# Patient Record
Sex: Female | Born: 1945 | Race: Black or African American | Hispanic: No | Marital: Married | Smoking: Never smoker
Health system: Southern US, Community
[De-identification: ages and names within clinical notes are randomized; demographics above are authoritative.]

## PROBLEM LIST (undated history)

## (undated) DIAGNOSIS — I639 Cerebral infarction, unspecified: Secondary | ICD-10-CM

## (undated) DIAGNOSIS — F329 Major depressive disorder, single episode, unspecified: Secondary | ICD-10-CM

## (undated) DIAGNOSIS — E119 Type 2 diabetes mellitus without complications: Secondary | ICD-10-CM

## (undated) DIAGNOSIS — E7800 Pure hypercholesterolemia, unspecified: Secondary | ICD-10-CM

## (undated) DIAGNOSIS — H269 Unspecified cataract: Secondary | ICD-10-CM

## (undated) DIAGNOSIS — D689 Coagulation defect, unspecified: Secondary | ICD-10-CM

## (undated) DIAGNOSIS — F32A Depression, unspecified: Secondary | ICD-10-CM

## (undated) DIAGNOSIS — I10 Essential (primary) hypertension: Secondary | ICD-10-CM

## (undated) HISTORY — DX: Unspecified cataract: H26.9

## (undated) HISTORY — PX: CATARACT EXTRACTION W/ INTRAOCULAR LENS  IMPLANT, BILATERAL: SHX1307

## (undated) HISTORY — DX: Major depressive disorder, single episode, unspecified: F32.9

## (undated) HISTORY — PX: LUMBAR DISC SURGERY: SHX700

## (undated) HISTORY — DX: Depression, unspecified: F32.A

## (undated) HISTORY — DX: Coagulation defect, unspecified: D68.9

---

## 1999-11-01 ENCOUNTER — Other Ambulatory Visit: Admission: RE | Admit: 1999-11-01 | Discharge: 1999-11-01 | Payer: Self-pay | Admitting: Obstetrics and Gynecology

## 2016-05-28 DIAGNOSIS — I639 Cerebral infarction, unspecified: Secondary | ICD-10-CM

## 2016-05-28 HISTORY — DX: Cerebral infarction, unspecified: I63.9

## 2016-05-28 HISTORY — PX: BREAST BIOPSY: SHX20

## 2016-07-14 ENCOUNTER — Emergency Department (HOSPITAL_COMMUNITY): Payer: Medicare Other

## 2016-07-14 ENCOUNTER — Emergency Department (HOSPITAL_COMMUNITY)
Admission: EM | Admit: 2016-07-14 | Discharge: 2016-07-14 | Disposition: A | Discharge status: Home / Self Care | Payer: Medicare Other | Attending: Emergency Medicine | Admitting: Emergency Medicine

## 2016-07-14 ENCOUNTER — Encounter (HOSPITAL_COMMUNITY): Payer: Self-pay | Admitting: *Deleted

## 2016-07-14 DIAGNOSIS — I10 Essential (primary) hypertension: Secondary | ICD-10-CM | POA: Diagnosis not present

## 2016-07-14 DIAGNOSIS — R930 Abnormal findings on diagnostic imaging of skull and head, not elsewhere classified: Secondary | ICD-10-CM | POA: Insufficient documentation

## 2016-07-14 DIAGNOSIS — Z794 Long term (current) use of insulin: Secondary | ICD-10-CM | POA: Insufficient documentation

## 2016-07-14 DIAGNOSIS — Z7982 Long term (current) use of aspirin: Secondary | ICD-10-CM | POA: Insufficient documentation

## 2016-07-14 DIAGNOSIS — E119 Type 2 diabetes mellitus without complications: Secondary | ICD-10-CM | POA: Insufficient documentation

## 2016-07-14 DIAGNOSIS — Z79899 Other long term (current) drug therapy: Secondary | ICD-10-CM | POA: Insufficient documentation

## 2016-07-14 DIAGNOSIS — Z8673 Personal history of transient ischemic attack (TIA), and cerebral infarction without residual deficits: Secondary | ICD-10-CM | POA: Diagnosis not present

## 2016-07-14 HISTORY — DX: Type 2 diabetes mellitus without complications: E11.9

## 2016-07-14 HISTORY — DX: Cerebral infarction, unspecified: I63.9

## 2016-07-14 HISTORY — DX: Essential (primary) hypertension: I10

## 2016-07-14 HISTORY — DX: Pure hypercholesterolemia, unspecified: E78.00

## 2016-07-14 LAB — COMPREHENSIVE METABOLIC PANEL
ALBUMIN: 3.6 g/dL (ref 3.5–5.0)
ALT: 67 U/L — ABNORMAL HIGH (ref 14–54)
AST: 40 U/L (ref 15–41)
Alkaline Phosphatase: 70 U/L (ref 38–126)
Anion gap: 8 (ref 5–15)
BUN: 11 mg/dL (ref 6–20)
CALCIUM: 9.5 mg/dL (ref 8.9–10.3)
CO2: 24 mmol/L (ref 22–32)
Chloride: 105 mmol/L (ref 101–111)
Creatinine, Ser: 0.74 mg/dL (ref 0.44–1.00)
GFR calc non Af Amer: >60 mL/min (ref >60)
GLUCOSE: 263 mg/dL — AB (ref 65–99)
POTASSIUM: 4.3 mmol/L (ref 3.5–5.1)
SODIUM: 137 mmol/L (ref 135–145)
Total Bilirubin: 0.7 mg/dL (ref 0.3–1.2)
Total Protein: 7 g/dL (ref 6.5–8.1)

## 2016-07-14 LAB — CBC WITH DIFFERENTIAL/PLATELET
BASOS PCT: 0 %
Basophils Absolute: 0 10*3/uL (ref 0.0–0.1)
EOS ABS: 0.1 10*3/uL (ref 0.0–0.7)
EOS PCT: 1 %
HCT: 36.3 % (ref 36.0–46.0)
HEMOGLOBIN: 12.6 g/dL (ref 12.0–15.0)
LYMPHS ABS: 2.9 10*3/uL (ref 0.7–4.0)
Lymphocytes Relative: 32 %
MCH: 31 pg (ref 26.0–34.0)
MCHC: 34.7 g/dL (ref 30.0–36.0)
MCV: 89.4 fL (ref 78.0–100.0)
MONO ABS: 0.6 10*3/uL (ref 0.1–1.0)
MONOS PCT: 6 %
NEUTROS PCT: 61 %
Neutro Abs: 5.6 10*3/uL (ref 1.7–7.7)
PLATELETS: 328 10*3/uL (ref 150–400)
RBC: 4.06 MIL/uL (ref 3.87–5.11)
RDW: 12.1 % (ref 11.5–15.5)
WBC: 9.2 10*3/uL (ref 4.0–10.5)

## 2016-07-14 LAB — I-STAT TROPONIN, ED: Troponin i, poc: 0 ng/mL (ref 0.00–0.08)

## 2016-07-14 MED ORDER — AMLODIPINE BESYLATE 5 MG PO TABS
5.0000 mg | ORAL_TABLET | Freq: Once | ORAL | Status: AC
Start: 2016-07-14 — End: 2016-07-14
  Administered 2016-07-14: 5 mg via ORAL
  Filled 2016-07-14: qty 1

## 2016-07-14 MED ORDER — AMLODIPINE BESYLATE 5 MG PO TABS: 5.0000 mg | ORAL_TABLET | Freq: Every day | ORAL | 0 refills | Status: DC

## 2016-07-14 NOTE — ED Notes (Signed)
Gave Pt a [place] sandwich and water per ok Delia Chimes

## 2016-07-14 NOTE — ED Notes (Signed)
Patient transported to CT 

## 2016-07-14 NOTE — ED Triage Notes (Signed)
Pt reports hx of stroke 2 weeks ago while visiting [place]. Pt had left side arm weakness and vision changes with stroke. Reports not feeling well since the stroke but has noticed that bp is elevated SBP >200 last night and today. Denies headache. bp is 187/86 at this time.

## 2016-07-14 NOTE — ED Provider Notes (Signed)
[facility] DEPT Provider Note   CSN: [id number] Arrival date & time: 07/14/16  0753     History   Chief Complaint Chief Complaint  Patient presents with  . Hypertension    HPI Claudia Jenkins is a 71 y.o. female.  HPI   71 yo F with PMHx of HTN, HLD, DM2, CVA who presents with hypertension. Pt was just hospitalized 2 weeks ago for acute CVA, with residual LUE weakness. She has been recovering well at home but over the last 2 days, noticed her BP rising. She took it last night and it was 220/110, and it was elevated again this morning. She went to [place] who sent her here. Denies any associated complaints. No HA, vision changes, CP, SOB, palpitations, No LE swelling. She has been taking her meds as prescribed. Was not on any meds prior to the hospitalization. No other medical complaints. No recent falls.  Past Medical History:  Diagnosis Date  . Diabetes mellitus without complication ([place])   . High cholesterol   . Hypertension   . Stroke [facility])     There are no active problems to display for this patient.   Past Surgical History:  Procedure Laterality Date  . BACK SURGERY      OB History    No data available       Home Medications    Prior to Admission medications   Medication Sig Start Date End Date Taking? Authorizing Provider  aspirin EC 81 MG tablet Take 81 mg by mouth daily.   Yes Historical Provider, MD  clopidogrel (PLAVIX) 75 MG tablet Take 75 mg by mouth daily.   Yes Historical Provider, MD  insulin glargine (LANTUS) 100 UNIT/ML injection Inject 45 Units into the skin at bedtime.   Yes Historical Provider, MD  losartan (COZAAR) 100 MG tablet Take 100 mg by mouth daily.   Yes Historical Provider, MD  rosuvastatin (CRESTOR) 40 MG tablet Take 40 mg by mouth daily.   Yes Historical Provider, MD  amLODipine (NORVASC) 5 MG tablet Take 1 tablet (5 mg total) by mouth daily. 07/14/16   Duffy Bruce, MD    Family History History reviewed. No pertinent family  history.  Social History Social History  Substance Use Topics  . Smoking status: Never Smoker  . Smokeless tobacco: Not on file  . Alcohol use No     Allergies   Penicillins   Review of Systems Review of Systems  Respiratory: Negative for chest tightness and shortness of breath.   Cardiovascular: Negative for chest pain and leg swelling.  Neurological: Positive for weakness (residual, left sided). Negative for headaches.  All other systems reviewed and are negative.    Physical Exam Updated Vital Signs BP 168/87   Pulse 98   Temp 98 F (36.7 C) (Oral)   Resp 20   Ht 5' 2.5" (1.588 m)   Wt 177 lb (80.3 kg)   SpO2 100%   BMI 31.86 kg/m   Physical Exam  Constitutional: She is oriented to person, place, and time. She appears well-developed and well-nourished. No distress.  HENT:  Head: Normocephalic and atraumatic.  Eyes: Conjunctivae are normal.  Neck: Neck supple.  Cardiovascular: Normal rate, regular rhythm and normal heart sounds.  Exam reveals no friction rub.   No murmur heard. Pulmonary/Chest: Effort normal and breath sounds normal. No respiratory distress. She has no wheezes. She has no rales.  Abdominal: She exhibits no distension.  Musculoskeletal: She exhibits no edema.  Neurological: She is alert  and oriented to person, place, and time. She exhibits normal muscle tone.  Skin: Skin is warm. Capillary refill takes less than 2 seconds.  Psychiatric: She has a normal mood and affect.  Nursing note and vitals reviewed.   Neurological Exam:  Mental Status: Alert and oriented to person, place, and time. Attention and concentration normal. Speech clear. Recent memory is intact. Cranial Nerves: Visual fields grossly intact. EOMI but dysconjugate gaze with mild left esotropia. No nystagmus noted. Facial sensation intact at forehead, maxillary cheek, and chin/mandible bilaterally. No facial asymmetry or weakness. Hearing grossly normal. Uvula is midline, and  palate elevates symmetrically. Normal SCM and trapezius strength. Tongue midline without fasciculations. Motor: Muscle strength 5/5 in proximal and distal UE and LE bilaterally, though subjectively diminished LUE. No pronator drift. Muscle tone normal. Reflexes: 2+ and symmetrical in all four extremities.  Sensation: Intact to light touch in upper and lower extremities distally bilaterally.  Gait: Normal without ataxia. Coordination: Normal FTN bilaterally.   ED Treatments / Results  Labs (all labs ordered are listed, but only abnormal results are displayed) Labs Reviewed  COMPREHENSIVE METABOLIC PANEL - Abnormal; Notable for the following:       Result Value   Glucose, Bld 263 (*)    ALT 67 (*)    All other components within normal limits  CBC WITH DIFFERENTIAL/PLATELET  I-STAT TROPOININ, ED    EKG  EKG Interpretation  Date/Time:  Saturday July 14 2016 09:01:33 EST Ventricular Rate:  83 PR Interval:    QRS Duration: 98 QT Interval:  370 QTC Calculation: 435 R Axis:   55 Text Interpretation:  Sinus rhythm LVH with secondary repolarization abnormality No old tracing to compare Confirmed by Cailin Gebel MD, Dearl Rudden [phone]) on 07/14/2016 9:19:42 AM       Radiology Ct Head Wo Contrast  Result Date: 07/14/2016 CLINICAL DATA:  Hypertension. Reported recent cerebrovascular accident EXAM: CT HEAD WITHOUT CONTRAST TECHNIQUE: Contiguous axial images were obtained from the base of the skull through the vertex without intravenous contrast. COMPARISON:  None. FINDINGS: Brain: There is age related volume loss. There is invagination of CSF into the sella. There is no intracranial mass, hemorrhage, extra-axial fluid collection, or midline shift. There is a recent appearing infarct in the medial right parietal lobe. There is an age uncertain but possibly fairly recent infarct in the mid to posterior right frontal lobe. Elsewhere, gray-white compartments are normal. Vascular: No hyperdense vessel.  There are foci of carotid siphon calcification bilaterally. Skull: There is bony destruction along the clivus with soft tissue opacity extending from the clivus into sphenoid sinus regions bilaterally. No acute fracture noted. Sinuses/Orbits: Opacification in posterior aspects of the sphenoid sinuses likely is due to mass causing destructive lesion involving the clivus. Other visualized paranasal sinuses are clear. Visualized orbits appear symmetric bilaterally. Other: Mastoid air cells are clear. IMPRESSION: Age uncertain but probable recent infarcts in the right parietal lobe and right frontal lobe regions. No hemorrhage. No well-defined mass or edema. Destructive lesion involving a portion of the clivus with soft tissue extension into the posterior sphenoid sinus regions. 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 This lesion measures 2.8 x 2.7 x 1.0 cm. Aggressive infection in this area is a differential consideration. 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 Advise brain MRI pre and post-contrast to further evaluate this lesion causing destruction of a portion of the clivus as well as to further evaluate the apparent infarcts in the right frontal and  right parietal lobes. Electronically Signed   By: Lowella Grip III M.D.   On: 07/14/2016 11:10    Procedures Procedures (including critical care time)  Medications Ordered in ED Medications  amLODipine (NORVASC) tablet 5 mg (5 mg Oral Given 07/14/16 1346)     Initial Impression / Assessment and Plan / ED Course  I have reviewed the triage vital signs and the nursing notes.  Pertinent labs & imaging results that were available during my care of the patient were reviewed by me and considered in my medical decision making (see chart for details).    71 yo F with PMHx of HTN, HLD, recent CVA here with asymptomatic HT. On arrival, pt hypertensive but o/w without complaints. Neuro exam is as above and is c/w her  previously documented exams from recent hospitalization at OSH (records reviewed). She has no HA, vision changes, CP, SOB, or sx to suggest HTN emergency/urgency. EKG is non-ischemic. Screening lab work unremarkable and shows no signs of new end organ dysfunction. Of note, CT head shows old infarct c/w her previously described MRIs. 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 I discussed case with Dr. Rande Lawman of Neurology. Will start pt on Norvasc given her HTN, recent CVA and DAPTuse and will d/c with outpt f/u. Pt in agreement. Return precautions given.  Final Clinical Impressions(s) / ED Diagnoses   Final diagnoses:  Essential hypertension    New Prescriptions Discharge Medication List as of 07/14/2016  1:49 PM    START taking these medications   Details  amLODipine (NORVASC) 5 MG tablet Take 1 tablet (5 mg total) by mouth daily., Starting Sat 07/14/2016, Print         Duffy Bruce, MD 07/14/16 2110

## 2016-07-14 NOTE — Discharge Instructions (Signed)
-   Take the new medication as prescribed. You can start this tomorrow, 2/18. - Be careful when transitioning from sitting to standing, or lying to sitting, as this medication may cause dizziness - Monitor your blood pressure closely  - Follow-up with ENT and Neurology as scheduled

## 2016-07-16 ENCOUNTER — Encounter: Payer: Self-pay | Admitting: Neurology

## 2016-07-16 ENCOUNTER — Ambulatory Visit (INDEPENDENT_AMBULATORY_CARE_PROVIDER_SITE_OTHER): Payer: Medicare Other | Admitting: Neurology

## 2016-07-16 VITALS — BP 187/98 | HR 107 | Ht 62.0 in | Wt 177.8 lb

## 2016-07-16 DIAGNOSIS — E1159 Type 2 diabetes mellitus with other circulatory complications: Secondary | ICD-10-CM | POA: Diagnosis not present

## 2016-07-16 DIAGNOSIS — I63513 Cerebral infarction due to unspecified occlusion or stenosis of bilateral middle cerebral arteries: Secondary | ICD-10-CM

## 2016-07-16 DIAGNOSIS — Z794 Long term (current) use of insulin: Secondary | ICD-10-CM | POA: Diagnosis not present

## 2016-07-16 DIAGNOSIS — E782 Mixed hyperlipidemia: Secondary | ICD-10-CM | POA: Diagnosis not present

## 2016-07-16 DIAGNOSIS — R220 Localized swelling, mass and lump, head: Secondary | ICD-10-CM | POA: Diagnosis not present

## 2016-07-16 DIAGNOSIS — I10 Essential (primary) hypertension: Secondary | ICD-10-CM | POA: Diagnosis not present

## 2016-07-16 DIAGNOSIS — J3489 Other specified disorders of nose and nasal sinuses: Secondary | ICD-10-CM

## 2016-07-16 DIAGNOSIS — I639 Cerebral infarction, unspecified: Secondary | ICD-10-CM | POA: Insufficient documentation

## 2016-07-16 NOTE — Patient Instructions (Signed)
-   continue plavix and creator for stroke prevention - increase ASA from 81mg  to 325mg  daily - will do CTA head and neck to evaluate vessels - check BP and glucose at home and record - BP goal not too low due to brain vessel closed, BP gaol is 130-160/70-90.  - follow up with cardiology and ENT - Follow up with your primary care physician for stroke risk factor modification. Recommend maintain blood pressure goal <130/80, diabetes with hemoglobin A1c goal below 7.0% and lipids with LDL cholesterol goal below 70 mg/dL.  - diabetic diet and regular exercise, diabetic education arranged by PCP.  - follow up in 3 months with me.

## 2016-07-17 ENCOUNTER — Telehealth: Payer: Self-pay | Admitting: Neurology

## 2016-07-17 DIAGNOSIS — J3489 Other specified disorders of nose and nasal sinuses: Secondary | ICD-10-CM | POA: Insufficient documentation

## 2016-07-17 DIAGNOSIS — I10 Essential (primary) hypertension: Secondary | ICD-10-CM | POA: Insufficient documentation

## 2016-07-17 DIAGNOSIS — E782 Mixed hyperlipidemia: Secondary | ICD-10-CM | POA: Insufficient documentation

## 2016-07-17 DIAGNOSIS — R220 Localized swelling, mass and lump, head: Secondary | ICD-10-CM

## 2016-07-17 DIAGNOSIS — E119 Type 2 diabetes mellitus without complications: Secondary | ICD-10-CM | POA: Insufficient documentation

## 2016-07-17 MED ORDER — ASPIRIN EC 325 MG PO TBEC: 325.0000 mg | DELAYED_RELEASE_TABLET | Freq: Every day | ORAL | 5 refills | Status: DC

## 2016-07-17 NOTE — Progress Notes (Signed)
NEUROLOGY CLINIC NEW PATIENT NOTE  NAME: Claudia Jenkins DOB: 1946-02-24 REFERRING PHYSICIAN: Velna Hatchet, MD  I saw Claudia Jenkins as a new consult in the neurovascular clinic today regarding  Chief Complaint  Patient presents with  . New Patient (Initial Visit)    Referral from [facility] for stroke, was in hospital in [place] for stroke while on vacation, Pt is from [place]  .  HPI: Claudia Jenkins is a 71 y.o. female with PMH of HTN, DM, HLD who presents as a new patient for a stroke.   Pt was on vacation in [place], [place] when she presented to [facility] on 06/21/16 for confusion for several days and left arm weakness for 3 days. As per husband, patient did not recognize her own car, take 20 minutes to complete things that normally will take her 30 seconds to complete. Patient seemed to have changes in vision because she was bumping into things at home. CT and MRI brain showed right MCA, MCA/PCA, and MCA/ACA watershed infarction. MRA head and neck showed multiple COW vessels involvement including occluded right PCA and right M1, and high grade stenosis of left M1 and A1. There is also occluded left VA. TTE EF > 65%. EKG shows sinus rhythm. LDL 160 and A1C 11.6. Local neurology was consulted and recommended DAPT. However, pt was discharge with ASA only along with crestor.   Back to [place], followed with PCP, put back on ASA 81mg  and plavix. Continued crestor. Referred here for further neurology follow up. Pt denies smoking or alcohol use. She was not aware that she has DM, HLD. She had HTN in the past but off BP meds due to BP in control. However, her BP was high during hospitatlization. Now on losartan, dose increased from 50mg  to 100mg .   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 ENT referral has been requested.  Cardiology referral also made due to HTN and left ventricular hypertrophy showing on TTE.       Past Medical History:  Diagnosis Date  . Diabetes mellitus without complication ([place])   . High cholesterol   . Hypertension   . Stroke [facility])    Past Surgical History:  Procedure Laterality Date  . BACK SURGERY     Family History  Problem Relation Age of Onset  . Stroke Sister    Current Outpatient Prescriptions  Medication Sig Dispense Refill  . amLODipine (NORVASC) 5 MG tablet Take 1 tablet (5 mg total) by mouth daily. (Patient taking differently: Take 10 mg by mouth daily. ) 30 tablet 0  . clopidogrel (PLAVIX) 75 MG tablet Take 75 mg by mouth daily.    . insulin glargine (LANTUS) 100 UNIT/ML injection Inject 45 Units into the skin at bedtime.    [name] losartan (COZAAR) 100 MG tablet Take 100 mg by mouth daily.    . rosuvastatin (CRESTOR) 40 MG tablet Take 40 mg by mouth daily.    [name] aspirin EC 325 MG tablet Take 1 tablet (325 mg total) by mouth daily. 30 tablet 5   No current facility-administered medications for this visit.    Allergies  Allergen Reactions  . Penicillins Rash   Social History   Social History  . Marital status: Married    Spouse name: N/A  .  Number of children: N/A  . Years of education: N/A   Occupational History  . Not on file.   Social History Main Topics  . Smoking status: Never Smoker  . Smokeless tobacco: Never Used  . Alcohol use No  . Drug use: No  . Sexual activity: Not on file   Other Topics Concern  . Not on file   Social History Narrative  . No narrative on file    Review of Systems Full 14 system review of systems performed and notable only for those listed, all others are neg:  Constitutional:  fatigue Cardiovascular:  Ear/Nose/Throat:   Skin:  Eyes:   Respiratory:   Gastroitestinal:  constipation Genitourinary:  Hematology/Lymphatic:   Endocrine:    Musculoskeletal:   Allergy/Immunology:   Neurological:  Confusion, weakness Psychiatric: decreased energy Sleep:    Physical Exam  Vitals:   07/16/16 1520  BP: (!) 187/98  Pulse: (!) 107    General - Well nourished, well developed, in no apparent distress.  Ophthalmologic - Sharp disc margins OU.  Cardiovascular - Regular rate and rhythm with no murmur.    Neck - supple, no nuchal rigidity .  Mental Status -  Level of arousal and orientation to time, place, and person were intact. Language including expression, naming, repetition, comprehension, reading, and writing was assessed and found intact. Attention span and concentration were normal. Fund of Knowledge was assessed and was intact.  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 III, IV, VI - Extraocular movements intact. V - Facial sensation intact bilaterally. VII - Facial movement intact bilaterally. VIII - Hearing & vestibular intact bilaterally. X - Palate elevates symmetrically. XI - Chin turning & shoulder shrug intact bilaterally. XII - Tongue protrusion intact.  Motor Strength - The patient's strength was normal in all extremities and pronator drift was absent.  Bulk was normal and fasciculations were absent.   Motor Tone - Muscle tone was assessed at the neck and appendages and was normal.  Reflexes - The patient's reflexes were normal in all extremities and she had no pathological reflexes.  Sensory - Light touch, temperature/pinprick were assessed and were decreased on the left arm.    Coordination - The patient had normal movements in the hands and feet with no ataxia or dysmetria.  Tremor was absent.  Gait and Station - The patient's transfers, posture, gait, station, and turns were observed as normal.   Imaging  I have personally reviewed the radiological images below and agree with the radiology interpretations.  MRI brain without contrast 06/25/2016 Expected  evolution of right cerebral hemisphere watershed vascular territory infarcts. No new restriction diffusion. Associated FLAIR signal. Stable associated local mass effect. No progressive midline shift. No evidence for hemorrhagic transformation. White matter signal abnormality likely reflective of chronic microvascular angiopathic change. Age appropriate brain parenchymal volume loss.  CT head without contrast 06/24/2016 Acute lacunar infarct within the left thalamus. Unchanged nonhemorrhagic subacute infarct in the right frontal and parieto-occipital lobes. Sphenoid sinus mass as described on the previous sinus MRI  CT head without contrast 06/23/2016 Stable areas of infarction in the right cerebral hemisphere likely subacute in the right frontal and parietoccipital lobes. no hemorrhage or mass effect. Stable lesion in the sphenoids in sinus.  MRA neck without contrast 06/22/2016 Left vertebral artery is occluded at its origin. No other hemodynamically significant stenosis of major arteries of the neck  MRA of the head without contrast 06/22/2016 Occluded left vertebral artery. Occluded  right PCA. Occluded right M1 segment with reconstitution of distal branches. High-grade stenosis of the left A1 and M1 segments  MRI sinus/brain with and without contrast 06/22/2016 1. Approximately 2.82.52 cm polyp mass, predominantly located within the posterior aspect of the sphenoid sinus. There are large areas of bony destruction present involving the floor of the sella, and a mass is contiguous with portions of the pituitary gland. This likely reflect of mama normal with no pituitary adenoma with growth into the sphenoid sinus. There is also destruction involving the floor of the sphenoid sinuses. There is concern for invasion of the right cavernous sinus. Neurosurgical/ENT evaluation is recommended. 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 The optic chiasm and hypothalamus extend inferiorly into the superior  aspect of the expanded sella. 2. multiple areas of enhancing early subacute infarction involving the watershed territory of the right cerebral hemisphere, as discussed above. Likely associated with minimal petechial hemorrhagic transformation associate with the infarct involving the lateral right occipital lobe. No associated significant mass effect or mass producing hematoma. Close clinical and/or imaging follow-up recommended. 3. normal unilateral increased T1 signal involving the right basal ganglia. This finding has been described in the setting of nonketotic hyperglycemia. Correlate clinically.  CT head without contrast 06/21/2016 1. Wedge-shaped hypodensity involving the right parietal occipital region concerning for evolving subacute infarct within the right MCA/PCA watershed territory. There is no hemorrhagic transformation. 2. age appropriate involutional changes of the brain with mild chronic microvascular ischemic change. 3. nonspecific soft tissue opacification within the sphenoid sinuses as described above. There are bony destructive changes which involving the floor of the sella as well as the floor of the sphenoid sinuses. The bony destructive changes raises suspicion for an underlying aggressive processes, such as sphenoid sinus mass lesion or invasive sinus disease. Recommend ENT consultation with direct visualization. Could also consider further evaluation with MRI of the sinus with and without contrast. Will will of the following  TTE  Moderate to severe concentric left ventricular hypertrophy is observed. There is a normal left ventricular systolic function. The estimated ejection fraction is greater than 65%. Abnormal left ventricular dystonic feeling is observed, consistent with impaired relaxation. There is a minimal aortic stenosis present. The peak instantaneous gradient of the aortic valve is 16 mmHg. There is a aortic annular calcification. Moderate aortic leaflet calcification  is visualized. There is mitral annular calcification. There is a trace of mitral regurgitation. There is a trace tricuspid Regurgitation. The Right Ventricular Systolic Pressure Is Calculated at 17 MmHg.  Lab Review 06/21/2016 WBC 12.15 platelet 364 hemoglobin 13.9  total cholesterol 250, TG 156, HDL 59, LDL 160 Glucose 199, creatinine 0.59, ALT 31, AST 23, hemoglobin A1c 11.6   Assessment and Plan:   In summary, Claudia Jenkins is a 71 y.o. female with PMH of HTN, DM, HLD admitted in [place], [place] on 06/21/16 for right MCA, MCA/PCA, and MCA/ACA watershed infarction. MRA head and neck showed multiple COW vessels involvement including occluded right PCA and right M1, and high grade stenosis of left M1 and A1. There is also occluded left VA. TTE EF > 65%. EKG shows sinus rhythm. LDL 160 and A1C 11.6. She is on ASA 81mg  and plavix and crestor. Her stroke most likely due to large vessel occlusion/stenosis due to uncontrolled HTN, DM and HLD. Will further pursue CTA head and neck as her MRA head and neck images not good enough for detailed evaluation. 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 Follow up with cardiology and ENT.    - continue plavix and creator for stroke prevention - increase ASA from 81mg  to 325mg  daily - will do CTA head and neck to evaluate vessels - check BP and glucose at home and record - BP goal not too low due to intracranial vessel occlusion, BP gaol is 130-160/70-90.  - follow up with cardiology and ENT - Follow up with your primary care physician for stroke risk factor modification. Recommend maintain blood pressure goal <130/80, diabetes with hemoglobin A1c goal below 7.0% and lipids with LDL cholesterol goal below 70 mg/dL.  - diabetic diet and regular exercise,  diabetic education.  - follow up in 3 months with me.   I encouraged the patient to discuss these important issues with her primary care physician.  I counseled the patient on measures to reduce stroke risk, including the importance of medication compliance, risk factor control, exercise, healthy diet, and avoidance of smoking.  I reviewed stroke warning signs and symptoms and appropriate actions to take if such occurs.   Thank you very much for the opportunity to participate in the care of this patient.  Please do not hesitate to call if any questions or concerns arise.  Orders Placed This Encounter  Procedures  . CT ANGIO HEAD W OR WO CONTRAST    Standing Status:   Future    Standing Expiration Date:   09/13/2017    Order Specific Question:   If indicated for the ordered procedure, I authorize the administration of contrast media per Radiology protocol    Answer:   Yes    Order Specific Question:   Reason for Exam (SYMPTOM  OR DIAGNOSIS REQUIRED)    Answer:   stroke    Order Specific Question:   Preferred imaging location?    Answer:   [facility]  . CT ANGIO NECK W OR WO CONTRAST    Standing Status:   Future    Standing Expiration Date:   09/13/2017    Order Specific Question:   If indicated for the ordered procedure, I authorize the administration of contrast media per Radiology protocol    Answer:   Yes    Order Specific Question:   Reason for Exam (SYMPTOM  OR DIAGNOSIS REQUIRED)    Answer:   stroke    Order Specific Question:   Preferred imaging location?    Answer:   Internal    Meds ordered this encounter  Medications  . aspirin EC 325 MG tablet    Sig: Take 1 tablet (325 mg total) by mouth daily.    Dispense:  30 tablet    Refill:  5    Patient Instructions  - continue plavix and creator for stroke prevention - increase ASA from 81mg  to 325mg  daily - will do CTA head and neck to evaluate vessels - check BP and glucose at home and record - BP goal not too  low due to brain vessel closed, BP gaol is 130-160/70-90.  - follow up with cardiology and ENT - Follow up with your primary care physician for stroke risk factor modification. Recommend maintain blood pressure goal <130/80, diabetes with hemoglobin A1c goal below 7.0% and lipids with LDL cholesterol goal below 70 mg/dL.  - diabetic diet and regular exercise, diabetic education arranged by PCP.  - follow up  in 3 months with me.    Rosalin Hawking, MD PhD [facility] [address], [place], [place] [address] [phone]

## 2016-07-17 NOTE — Telephone Encounter (Addendum)
Patient is calling to advise she is taking Linzess for bowel movement. She was to call and report this to the nurse. She also said she had a CT scan on 07-14-16 at [facility].

## 2016-07-18 NOTE — Telephone Encounter (Signed)
Change made to patients medication list. Linzess put on medication list.

## 2016-08-15 ENCOUNTER — Encounter: Payer: Self-pay | Admitting: Neurology

## 2016-08-15 NOTE — Progress Notes (Signed)
Clearance letter fax to [facility] at [phone] and [phone]. Both faxes were receive and confirmed.

## 2016-08-20 ENCOUNTER — Ambulatory Visit
Admission: RE | Admit: 2016-08-20 | Discharge: 2016-08-20 | Disposition: A | Discharge status: Home / Self Care | Payer: Medicare Other | Source: Ambulatory Visit | Attending: Neurology | Admitting: Neurology

## 2016-08-20 DIAGNOSIS — I63513 Cerebral infarction due to unspecified occlusion or stenosis of bilateral middle cerebral arteries: Secondary | ICD-10-CM

## 2016-08-20 MED ORDER — IOPAMIDOL (ISOVUE-370) INJECTION 76%
75.0000 mL | Freq: Once | INTRAVENOUS | Status: AC | PRN
  Administered 2016-08-20: 75 mL via INTRAVENOUS

## 2016-08-22 ENCOUNTER — Ambulatory Visit: Payer: Self-pay | Admitting: Otolaryngology

## 2016-08-22 ENCOUNTER — Telehealth: Payer: Self-pay

## 2016-08-22 NOTE — H&P (Signed)
HPI:   Claudia Jenkins is a 71 y.o. female who presents as a consult Patient.   Referring Provider: Holwerda, Aundria Mems, *  Chief complaint: Sphenoid sinus mass.  HPI: A few weeks ago while on vacation in [place] she suffered a stroke. During her workup there, CT of the head and MRI revealed what appeared to be an expansile mass in the sphenoid. She has never had any symptoms related to her nose or sinuses. She has had some trouble with her vision and is being worked up for possible visual field defect.  PMH/Meds/All/SocHx/FamHx/ROS:   Past Medical History:  Diagnosis Date  . Diabetes mellitus ([place])  . Hypertension  . Stroke [facility])   Past Surgical History:  Procedure Laterality Date  . BACK SURGERY   No family history of bleeding disorders, wound healing problems or difficulty with anesthesia.   Social History   Social History  . Marital status: N/A  Spouse name: N/A  . Number of children: N/A  . Years of education: N/A   Occupational History  . Not on file.   Social History Main Topics  . Smoking status: Never Smoker  . Smokeless tobacco: Never Used  . Alcohol use Not on file  . Drug use: Unknown  . Sexual activity: Not on file   Other Topics Concern  . Not on file   Social History Narrative  . No narrative on file   Current Outpatient Prescriptions:  . amLODIPine (NORVASC) 10 MG tablet, , Disp: , Rfl:  . aspirin 325 MG EC tablet *ANTIPLATELET*, Take by mouth., Disp: , Rfl:  . clopidogrel (PLAVIX) 75 mg tablet *ANTIPLATELET*, Take by mouth., Disp: , Rfl:  . insulin glargine (LANTUS) 100 unit/mL injection, Inject 45 Units into the skin., Disp: , Rfl:  . losartan (COZAAR) 100 MG tablet, Take by mouth., Disp: , Rfl:  . rosuvastatin (CRESTOR) 40 MG tablet, Take by mouth., Disp: , Rfl:   A complete ROS was performed with pertinent positives/negatives noted in the HPI. The remainder of the ROS are negative.   Physical Exam:   BP 150/80  Ht 1.581 m (5' 2.25")   Wt 80.3 kg (177 lb)  BMI 32.11 kg/m   General: Healthy and alert, in no distress, breathing easily. Normal affect. In a pleasant mood. Head: Normocephalic, atraumatic. No masses, or scars. Eyes: Pupils are equal, and reactive to light. Vision is grossly intact. No spontaneous or gaze nystagmus. Ears: Ear canals are clear. Tympanic membranes are intact, with normal landmarks and the middle ears are clear and healthy. Hearing: Grossly normal. Nose: Nasal cavities are clear with healthy mucosa, no polyps or exudate.Airways are patent. Face: No masses or scars, facial nerve function is symmetric. Oral Cavity: No mucosal abnormalities are noted. Tongue with normal mobility. Dentition appears healthy. Oropharynx: Tonsils are symmetric. There are no mucosal masses identified. Tongue base appears normal and healthy. Nasopharynx appears by intranasal exam with an otoscope. Larynx/Hypopharynx: deferred Chest: Deferred Neck: No palpable masses, no cervical adenopathy, no thyroid nodules or enlargement. Neuro: Cranial nerves II-XII will normal function. Balance: Normal gate. Other findings: none.  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 I have reviewed the CT scans. Information is limited based on the fact that they were head CTs and do not give full detail of the sinus anatomy. Recommend a dedicated sinus CT scan to  further evaluate this. We will discuss the possible need for biopsy following that.

## 2016-08-22 NOTE — Telephone Encounter (Signed)
-----   Message from Rosalin Hawking, MD sent at 08/20/2016  6:06 PM EDT ----- Could you please let the patient know that the CT test done today for looking at vessels showed similar findings as before with multiple vessel narrowing in the brain which related to her stroke in 05/2016. She needs to continue her current medication and check BP at home and BP not too low. Ideally, systolic BP goal 977-414. Thanks.  Rosalin Hawking, MD PhD Stroke Neurology 08/20/2016 6:06 PM

## 2016-08-22 NOTE — Telephone Encounter (Signed)
RN call patient back about her Ct angio head with or without contrast. Rn stated test done was looking for looking at vessels showed similar findings as before with multiple vessel narrowing in the brain which related to her stroke in 05/2016. She needs to continue her current medication and check BP at home and BP not too low. Ideally, systolic BP goal 267-124. Pt verbalized understanding and is taking her blood pressure and will keep a log of it.

## 2016-08-24 ENCOUNTER — Encounter (HOSPITAL_COMMUNITY)
Admission: RE | Admit: 2016-08-24 | Discharge: 2016-08-24 | Disposition: A | Discharge status: Home / Self Care | Payer: Medicare Other | Source: Ambulatory Visit | Attending: Otolaryngology | Admitting: Otolaryngology

## 2016-08-24 ENCOUNTER — Encounter (HOSPITAL_COMMUNITY): Payer: Self-pay

## 2016-08-24 ENCOUNTER — Other Ambulatory Visit (HOSPITAL_COMMUNITY): Payer: Medicare Other

## 2016-08-24 DIAGNOSIS — E119 Type 2 diabetes mellitus without complications: Secondary | ICD-10-CM | POA: Insufficient documentation

## 2016-08-24 DIAGNOSIS — Z794 Long term (current) use of insulin: Secondary | ICD-10-CM | POA: Diagnosis not present

## 2016-08-24 DIAGNOSIS — Z01812 Encounter for preprocedural laboratory examination: Secondary | ICD-10-CM | POA: Diagnosis not present

## 2016-08-24 DIAGNOSIS — Z8673 Personal history of transient ischemic attack (TIA), and cerebral infarction without residual deficits: Secondary | ICD-10-CM | POA: Diagnosis not present

## 2016-08-24 DIAGNOSIS — J341 Cyst and mucocele of nose and nasal sinus: Secondary | ICD-10-CM | POA: Insufficient documentation

## 2016-08-24 DIAGNOSIS — Z7982 Long term (current) use of aspirin: Secondary | ICD-10-CM | POA: Diagnosis not present

## 2016-08-24 DIAGNOSIS — Z79899 Other long term (current) drug therapy: Secondary | ICD-10-CM | POA: Insufficient documentation

## 2016-08-24 DIAGNOSIS — I10 Essential (primary) hypertension: Secondary | ICD-10-CM | POA: Diagnosis not present

## 2016-08-24 LAB — BASIC METABOLIC PANEL
Anion gap: 10 (ref 5–15)
BUN: 17 mg/dL (ref 6–20)
CO2: 24 mmol/L (ref 22–32)
Calcium: 9.6 mg/dL (ref 8.9–10.3)
Chloride: 105 mmol/L (ref 101–111)
Creatinine, Ser: 0.89 mg/dL (ref 0.44–1.00)
GFR calc Af Amer: >60 mL/min (ref >60)
Glucose, Bld: 95 mg/dL (ref 65–99)
POTASSIUM: 4.5 mmol/L (ref 3.5–5.1)
SODIUM: 139 mmol/L (ref 135–145)

## 2016-08-24 LAB — CBC
HEMATOCRIT: 36.3 % (ref 36.0–46.0)
Hemoglobin: 12.4 g/dL (ref 12.0–15.0)
MCH: 30.8 pg (ref 26.0–34.0)
MCHC: 34.2 g/dL (ref 30.0–36.0)
MCV: 90.1 fL (ref 78.0–100.0)
Platelets: 389 10*3/uL (ref 150–400)
RBC: 4.03 MIL/uL (ref 3.87–5.11)
RDW: 12.9 % (ref 11.5–15.5)
WBC: 12.6 10*3/uL — AB (ref 4.0–10.5)

## 2016-08-24 LAB — GLUCOSE, CAPILLARY: GLUCOSE-CAPILLARY: 105 mg/dL — AB (ref 65–99)

## 2016-08-24 NOTE — Pre-Procedure Instructions (Addendum)
[place]  08/24/2016      [facility], [place] [address] Phone: [phone] Fax: [phone]    Your procedure is scheduled on Wednesday, April 4.  Report to [facility] Admitting at 11:20 A.M.               Your surgery or procedure is scheduled for 1:20 PM   Call this number if you have problems the morning of surgery: [phone]    Remember:  Do not eat food or drink liquids after midnight Tuesday, April 3.  Take these medicines the morning of surgery with A SIP OF WATER : amLODipine (NORVASC),                     Do NOT Take Empagliflozin-Metformin HCl  (SYNJARDY )n the morning of surgery.                   STOP Plavix  as instructed by Dr Constance Holster and Dr Xu.--Per dr note stop today   How to Manage Your Diabetes Before and After Surgery  Why is it important to control my blood sugar before and after surgery? . Improving blood sugar levels before and after surgery helps healing and can limit problems. . A way of improving blood sugar control is eating a healthy diet by: o  Eating less sugar and carbohydrates o  Increasing activity/exercise o  Talking with your doctor about reaching your blood sugar goals . High blood sugars (greater than 180 mg/dL) can raise your risk of infections and slow your recovery, so you will need to focus on controlling your diabetes during the weeks before surgery. . Make sure that the doctor who takes care of your diabetes knows about your planned surgery including the date and location.  How do I manage my blood sugar before surgery? . Check your blood sugar at least 4 times a day, starting 2 days before surgery, to make sure that the level is not too high or low. o Check your blood sugar the morning of your surgery when you wake up and every 2 hours until you get to the Short Stay unit. . If your blood sugar is less than 70 mg/dL,  you will need to treat for low blood sugar: o Do not take insulin. o Treat a low blood sugar (less than 70 mg/dL) with  cup of clear juice (cranberry or apple), 4 glucose tablets, OR glucose gel. o Recheck blood sugar in 15 minutes after treatment (to make sure it is greater than 70 mg/dL). If your blood sugar is not greater than 70 mg/dL on recheck, call [phone] for further instructions. . Report your blood sugar to the short stay nurse when you get to Short Stay.  . If you are admitted to the hospital after surgery: o Your blood sugar will be checked by the staff and you will probably be given insulin after surgery (instead of oral diabetes medicines) to make sure you have good blood sugar levels. o The goal for blood sugar control after surgery is 80-180 mg/dL.  WHAT DO I DO ABOUT MY DIABETES MEDICATION  . Do not take oral diabetes medicines (pills) the morning of surgery.(No synjardy am of surgery)  . THE NIGHT BEFORE SURGERY, take 17 units of  Lantus insulin.       [place]- Preparing For Surgery  Before surgery, you  can play an important role. Because skin is not sterile, your skin needs to be as free of germs as possible. You can reduce the number of germs on your skin by washing with CHG (chlorahexidine gluconate) Soap before surgery.  CHG is an antiseptic cleaner which kills germs and bonds with the skin to continue killing germs even after washing.  Please do not use if you have an allergy to CHG or antibacterial soaps. If your skin becomes reddened/irritated stop using the CHG.  Do not shave (including legs and underarms) for at least 48 hours prior to first CHG shower. It is OK to shave your face.  Please follow these instructions carefully.   1. Shower the NIGHT BEFORE SURGERY and the MORNING OF SURGERY with CHG.   2. If you chose to wash your hair, wash your hair first as usual with your normal shampoo.  3. After you shampoo, rinse your hair and body thoroughly to  remove the shampoo.  4. Use CHG as you would any other liquid soap. You can apply CHG directly to the skin and wash gently with a scrungie or a clean washcloth.   5. Apply the CHG Soap to your body ONLY FROM THE NECK DOWN.  Do not use on open wounds or open sores. Avoid contact with your eyes, ears, mouth and genitals (private parts). Wash genitals (private parts) with your normal soap.  6. Wash thoroughly, paying special attention to the area where your surgery will be performed.  7. Thoroughly rinse your body with warm water from the neck down.  8. DO NOT shower/wash with your normal soap after using and rinsing off the CHG Soap.  9. Pat yourself dry with a CLEAN TOWEL.   10. Wear CLEAN PAJAMAS   11. Place CLEAN SHEETS on your bed the night of your first shower and DO NOT SLEEP WITH PETS.  Day of Surgery: Do not apply any deodorants/lotions. Please wear clean clothes to the hospital/surgery center.    Do not wear jewelry, make-up or nail polish.  Do not wear lotions, powders, or perfumes, or deoderant.  Do not shave 48 hours prior to surgery.  Men may shave face and neck.  Do not bring valuables to the hospital.  [facility] is not responsible for any belongings or valuables.  Contacts, dentures or bridgework may not be worn into surgery.  Leave your suitcase in the car.  After surgery it may be brought to your room.  For patients admitted to the hospital, discharge time will be determined by your treatment team.  Please read over the following fact sheets that you were given.

## 2016-08-25 LAB — HEMOGLOBIN A1C
Hgb A1c MFr Bld: 8.5 % — ABNORMAL HIGH (ref 4.8–5.6)
Mean Plasma Glucose: 197 mg/dL

## 2016-08-27 NOTE — Progress Notes (Signed)
Anesthesia Chart Review:  Pt is a 71 year old female scheduled for nasal endoscopy with biopsy of sphenoid and clivius mass on 08/29/2016 with Arville Care, MD  - PCP is Velna Hatchet, MD - Neurologist is Rosalin Hawking, MD, who cleared pt for surgery and gave ok to hold plavix for surgery.   PMH includes:  HTN, stroke (06/21/16 in [place]), DM, hyperlipidemia. Never smoker. BMI 31.  Medications include: Amlodipine, ASA 325 mg, Plavix, empagliflozin-metformin, Lantus, Cozaar, metoprolol, rosuvastatin. Pt stopped plavix 08/23/16. Pt to continue ASA perioperatively.   Preoperative labs reviewed.   - HbA1c 8.5, glucose 95 - PT/INR to be obtained DOS.   EKG 07/14/16: Sinus rhythm. LVH with secondary repolarization abnormality  Nuclear stress test 07/27/16 [facility] cardiovascular): - Normal myocardial perfusion imaging study with a fixed defect in the inferior wall suggesting an area of soft tissue attenuation from breast tissue. LV systolic function normal without regional wall motion abnormalities. EF 71%. This is a low risk study.  If no changes, I anticipate pt can proceed with surgery as scheduled.   Willeen Cass, FNP-BC [facility] Short Stay Surgical Center/Anesthesiology Phone: [phone] 08/27/2016 2:11 PM

## 2016-08-28 MED ORDER — CLINDAMYCIN PHOSPHATE 900 MG/50ML IV SOLN
900.0000 mg | INTRAVENOUS | Status: AC
  Administered 2016-08-29: 900 mg via INTRAVENOUS
  Filled 2016-08-28: qty 50

## 2016-08-28 MED ORDER — OXYMETAZOLINE HCL 0.05 % NA SOLN
3.0000 | NASAL | Status: AC
  Administered 2016-08-29: 3 via NASAL
  Filled 2016-08-28: qty 15

## 2016-08-29 ENCOUNTER — Encounter (HOSPITAL_COMMUNITY)
Admission: RE | Disposition: A | Discharge status: Home / Self Care | Payer: Self-pay | Source: Ambulatory Visit | Attending: Otolaryngology

## 2016-08-29 ENCOUNTER — Encounter (HOSPITAL_COMMUNITY): Payer: Self-pay | Admitting: *Deleted

## 2016-08-29 ENCOUNTER — Ambulatory Visit (HOSPITAL_COMMUNITY)
Admission: RE | Admit: 2016-08-29 | Discharge: 2016-08-29 | Disposition: A | Discharge status: Home / Self Care | Payer: Medicare Other | Source: Ambulatory Visit | Attending: Otolaryngology | Admitting: Otolaryngology

## 2016-08-29 ENCOUNTER — Ambulatory Visit (HOSPITAL_COMMUNITY): Payer: Medicare Other | Admitting: Certified Registered Nurse Anesthetist

## 2016-08-29 ENCOUNTER — Ambulatory Visit (HOSPITAL_COMMUNITY): Payer: Medicare Other | Admitting: Vascular Surgery

## 2016-08-29 DIAGNOSIS — Z8673 Personal history of transient ischemic attack (TIA), and cerebral infarction without residual deficits: Secondary | ICD-10-CM | POA: Insufficient documentation

## 2016-08-29 DIAGNOSIS — Z7982 Long term (current) use of aspirin: Secondary | ICD-10-CM | POA: Diagnosis not present

## 2016-08-29 DIAGNOSIS — E119 Type 2 diabetes mellitus without complications: Secondary | ICD-10-CM | POA: Diagnosis not present

## 2016-08-29 DIAGNOSIS — D352 Benign neoplasm of pituitary gland: Secondary | ICD-10-CM | POA: Diagnosis not present

## 2016-08-29 DIAGNOSIS — I10 Essential (primary) hypertension: Secondary | ICD-10-CM | POA: Insufficient documentation

## 2016-08-29 DIAGNOSIS — R220 Localized swelling, mass and lump, head: Secondary | ICD-10-CM | POA: Diagnosis present

## 2016-08-29 DIAGNOSIS — Z79899 Other long term (current) drug therapy: Secondary | ICD-10-CM | POA: Diagnosis not present

## 2016-08-29 DIAGNOSIS — Z794 Long term (current) use of insulin: Secondary | ICD-10-CM | POA: Diagnosis not present

## 2016-08-29 HISTORY — PX: NASAL SINUS SURGERY: SHX719

## 2016-08-29 LAB — GLUCOSE, CAPILLARY
GLUCOSE-CAPILLARY: 92 mg/dL (ref 65–99)
Glucose-Capillary: 90 mg/dL (ref 65–99)

## 2016-08-29 LAB — PROTIME-INR
INR: 0.95
PROTHROMBIN TIME: 12.6 s (ref 11.4–15.2)

## 2016-08-29 SURGERY — SINUS SURGERY, ENDOSCOPIC
Anesthesia: General

## 2016-08-29 MED ORDER — ARTIFICIAL TEARS OP OINT
TOPICAL_OINTMENT | OPHTHALMIC | Status: DC | PRN
  Administered 2016-08-29: 1 via OPHTHALMIC

## 2016-08-29 MED ORDER — SUGAMMADEX SODIUM 500 MG/5ML IV SOLN
INTRAVENOUS | Status: DC | PRN
  Administered 2016-08-29: 300 mg via INTRAVENOUS

## 2016-08-29 MED ORDER — ONDANSETRON HCL 4 MG/2ML IJ SOLN
INTRAMUSCULAR | Status: AC
  Filled 2016-08-29: qty 2

## 2016-08-29 MED ORDER — PROMETHAZINE HCL 25 MG/ML IJ SOLN: 6.2500 mg | INTRAMUSCULAR | Status: DC | PRN

## 2016-08-29 MED ORDER — DEXAMETHASONE SODIUM PHOSPHATE 10 MG/ML IJ SOLN
INTRAMUSCULAR | Status: AC
  Filled 2016-08-29: qty 1

## 2016-08-29 MED ORDER — ROCURONIUM BROMIDE 50 MG/5ML IV SOSY
PREFILLED_SYRINGE | INTRAVENOUS | Status: AC
  Filled 2016-08-29: qty 10

## 2016-08-29 MED ORDER — LIDOCAINE 2% (20 MG/ML) 5 ML SYRINGE
INTRAMUSCULAR | Status: AC
  Filled 2016-08-29: qty 5

## 2016-08-29 MED ORDER — LACTATED RINGERS IV SOLN
INTRAVENOUS | Status: DC
  Administered 2016-08-29: 12:00:00 via INTRAVENOUS

## 2016-08-29 MED ORDER — LIDOCAINE-EPINEPHRINE 2 %-1:100000 IJ SOLN
INTRAMUSCULAR | Status: DC | PRN
  Administered 2016-08-29: 4 mL

## 2016-08-29 MED ORDER — LIDOCAINE 2% (20 MG/ML) 5 ML SYRINGE
INTRAMUSCULAR | Status: AC
  Filled 2016-08-29: qty 10

## 2016-08-29 MED ORDER — ARTIFICIAL TEARS OP OINT
TOPICAL_OINTMENT | OPHTHALMIC | Status: AC
  Filled 2016-08-29: qty 3.5

## 2016-08-29 MED ORDER — FENTANYL CITRATE (PF) 100 MCG/2ML IJ SOLN
INTRAMUSCULAR | Status: DC | PRN
  Administered 2016-08-29: 100 ug via INTRAVENOUS

## 2016-08-29 MED ORDER — ROCURONIUM BROMIDE 50 MG/5ML IV SOSY
PREFILLED_SYRINGE | INTRAVENOUS | Status: AC
  Filled 2016-08-29: qty 5

## 2016-08-29 MED ORDER — HYDROMORPHONE HCL 1 MG/ML IJ SOLN: 0.2500 mg | INTRAMUSCULAR | Status: DC | PRN

## 2016-08-29 MED ORDER — PHENYLEPHRINE 40 MCG/ML (10ML) SYRINGE FOR IV PUSH (FOR BLOOD PRESSURE SUPPORT)
PREFILLED_SYRINGE | INTRAVENOUS | Status: AC
  Filled 2016-08-29: qty 10

## 2016-08-29 MED ORDER — SUGAMMADEX SODIUM 200 MG/2ML IV SOLN
INTRAVENOUS | Status: AC
  Filled 2016-08-29: qty 2

## 2016-08-29 MED ORDER — LIDOCAINE-EPINEPHRINE 2 %-1:100000 IJ SOLN
INTRAMUSCULAR | Status: AC
  Filled 2016-08-29: qty 1

## 2016-08-29 MED ORDER — LACTATED RINGERS IV SOLN: INTRAVENOUS | Status: DC

## 2016-08-29 MED ORDER — LIDOCAINE HCL (CARDIAC) 20 MG/ML IV SOLN
INTRAVENOUS | Status: DC | PRN
  Administered 2016-08-29: 80 mg via INTRAVENOUS

## 2016-08-29 MED ORDER — PROPOFOL 10 MG/ML IV BOLUS
INTRAVENOUS | Status: DC | PRN
  Administered 2016-08-29: 200 mg via INTRAVENOUS

## 2016-08-29 MED ORDER — PROPOFOL 10 MG/ML IV BOLUS
INTRAVENOUS | Status: AC
  Filled 2016-08-29: qty 20

## 2016-08-29 MED ORDER — ONDANSETRON HCL 4 MG/2ML IJ SOLN
INTRAMUSCULAR | Status: DC | PRN
  Administered 2016-08-29: 4 mg via INTRAVENOUS

## 2016-08-29 MED ORDER — MIDAZOLAM HCL 2 MG/2ML IJ SOLN
INTRAMUSCULAR | Status: AC
  Filled 2016-08-29: qty 2

## 2016-08-29 MED ORDER — PHENYLEPHRINE HCL 10 MG/ML IJ SOLN
INTRAVENOUS | Status: DC | PRN
  Administered 2016-08-29: 25 ug/min via INTRAVENOUS

## 2016-08-29 MED ORDER — DEXAMETHASONE SODIUM PHOSPHATE 10 MG/ML IJ SOLN
INTRAMUSCULAR | Status: DC | PRN
  Administered 2016-08-29: 5 mg via INTRAVENOUS

## 2016-08-29 MED ORDER — ROCURONIUM BROMIDE 100 MG/10ML IV SOLN
INTRAVENOUS | Status: DC | PRN
  Administered 2016-08-29: 50 mg via INTRAVENOUS

## 2016-08-29 MED ORDER — FENTANYL CITRATE (PF) 250 MCG/5ML IJ SOLN
INTRAMUSCULAR | Status: AC
  Filled 2016-08-29: qty 5

## 2016-08-29 MED ORDER — OXYMETAZOLINE HCL 0.05 % NA SOLN
NASAL | Status: DC | PRN
  Administered 2016-08-29: 1 via TOPICAL

## 2016-08-29 MED ORDER — PHENYLEPHRINE HCL 10 MG/ML IJ SOLN
INTRAMUSCULAR | Status: DC | PRN
  Administered 2016-08-29 (×2): 80 ug via INTRAVENOUS

## 2016-08-29 MED ORDER — 0.9 % SODIUM CHLORIDE (POUR BTL) OPTIME
TOPICAL | Status: DC | PRN
  Administered 2016-08-29: 1000 mL

## 2016-08-29 SURGICAL SUPPLY — 18 items
CANISTER SUCT 3000ML PPV (MISCELLANEOUS) ×3 IMPLANT
CONT SPEC 4OZ CLIKSEAL STRL BL (MISCELLANEOUS) ×3 IMPLANT
DRSG NASOPORE 8CM (GAUZE/BANDAGES/DRESSINGS) ×3 IMPLANT
ELECT REM PT RETURN 9FT ADLT (ELECTROSURGICAL) ×3
ELECTRODE REM PT RTRN 9FT ADLT (ELECTROSURGICAL) ×1 IMPLANT
GOWN STRL REUS W/ TWL LRG LVL3 (GOWN DISPOSABLE) ×2 IMPLANT
GOWN STRL REUS W/TWL LRG LVL3 (GOWN DISPOSABLE) ×4
KIT BASIN OR (CUSTOM PROCEDURE TRAY) ×3 IMPLANT
KIT ROOM TURNOVER OR (KITS) ×3 IMPLANT
NEEDLE PRECISIONGLIDE 27X1.5 (NEEDLE) ×3 IMPLANT
NEEDLE SPNL 25GX3.5 QUINCKE BL (NEEDLE) ×3 IMPLANT
NS IRRIG 1000ML POUR BTL (IV SOLUTION) ×3 IMPLANT
PAD ARMBOARD 7.5X6 YLW CONV (MISCELLANEOUS) ×6 IMPLANT
PATTIES SURGICAL .5 X3 (DISPOSABLE) ×3 IMPLANT
TOWEL OR 17X24 6PK STRL BLUE (TOWEL DISPOSABLE) ×3 IMPLANT
TRAY ENT MC OR (CUSTOM PROCEDURE TRAY) ×3 IMPLANT
TUBE CONNECTING 12'X1/4 (SUCTIONS) ×1
TUBE CONNECTING 12X1/4 (SUCTIONS) ×2 IMPLANT

## 2016-08-29 NOTE — Anesthesia Procedure Notes (Signed)
Procedure Name: Intubation Date/Time: 08/29/2016 1:34 PM Performed by: Candis Shine Pre-anesthesia Checklist: Patient identified, Emergency Drugs available, Suction available and Patient being monitored Patient Re-evaluated:Patient Re-evaluated prior to inductionOxygen Delivery Method: Circle System Utilized Preoxygenation: Pre-oxygenation with 100% oxygen Intubation Type: IV induction Ventilation: Mask ventilation without difficulty Laryngoscope Size: Mac and 3 Grade View: Grade I Tube type: Oral Tube size: 7.0 mm Number of attempts: 1 Airway Equipment and Method: Stylet Placement Confirmation: ETT inserted through vocal cords under direct vision,  positive ETCO2 and breath sounds checked- equal and bilateral Secured at: 22 cm Tube secured with: Tape Dental Injury: Teeth and Oropharynx as per pre-operative assessment

## 2016-08-29 NOTE — Transfer of Care (Signed)
Immediate Anesthesia Transfer of Care Note  Patient: Claudia Jenkins  Procedure(s) Performed: Procedure(s): NASAL ENDOSCOPY WITH BIOPSY OF SPHENOID AND CLIVIUS MASS (N/A)  Patient Location: PACU  Anesthesia Type:General  Level of Consciousness: awake, alert  and oriented  Airway & Oxygen Therapy: Patient Spontanous Breathing and Patient connected to nasal cannula oxygen  Post-op Assessment: Report given to RN and Post -op Vital signs reviewed and stable  Post vital signs: Reviewed and stable  Last Vitals:  Vitals:   08/29/16 1122  BP: (!) 188/73  Pulse: 79  Resp: 18  Temp: 36.6 C    Last Pain:  Vitals:   08/29/16 1122  TempSrc: Oral         Complications: No apparent anesthesia complications

## 2016-08-29 NOTE — H&P (View-Only) (Signed)
HPI:   Claudia Jenkins is a 71 y.o. female who presents as a consult Patient.   Referring Provider: Holwerda, Aundria Mems, *  Chief complaint: Sphenoid sinus mass.  HPI: A few weeks ago while on vacation in [place] she suffered a stroke. During her workup there, CT of the head and MRI revealed what appeared to be an expansile mass in the sphenoid. She has never had any symptoms related to her nose or sinuses. She has had some trouble with her vision and is being worked up for possible visual field defect.  PMH/Meds/All/SocHx/FamHx/ROS:   Past Medical History:  Diagnosis Date  . Diabetes mellitus ([place])  . Hypertension  . Stroke [facility])   Past Surgical History:  Procedure Laterality Date  . BACK SURGERY   No family history of bleeding disorders, wound healing problems or difficulty with anesthesia.   Social History   Social History  . Marital status: N/A  Spouse name: N/A  . Number of children: N/A  . Years of education: N/A   Occupational History  . Not on file.   Social History Main Topics  . Smoking status: Never Smoker  . Smokeless tobacco: Never Used  . Alcohol use Not on file  . Drug use: Unknown  . Sexual activity: Not on file   Other Topics Concern  . Not on file   Social History Narrative  . No narrative on file   Current Outpatient Prescriptions:  . amLODIPine (NORVASC) 10 MG tablet, , Disp: , Rfl:  . aspirin 325 MG EC tablet *ANTIPLATELET*, Take by mouth., Disp: , Rfl:  . clopidogrel (PLAVIX) 75 mg tablet *ANTIPLATELET*, Take by mouth., Disp: , Rfl:  . insulin glargine (LANTUS) 100 unit/mL injection, Inject 45 Units into the skin., Disp: , Rfl:  . losartan (COZAAR) 100 MG tablet, Take by mouth., Disp: , Rfl:  . rosuvastatin (CRESTOR) 40 MG tablet, Take by mouth., Disp: , Rfl:   A complete ROS was performed with pertinent positives/negatives noted in the HPI. The remainder of the ROS are negative.   Physical Exam:   BP 150/80  Ht 1.581 m (5' 2.25")   Wt 80.3 kg (177 lb)  BMI 32.11 kg/m   General: Healthy and alert, in no distress, breathing easily. Normal affect. In a pleasant mood. Head: Normocephalic, atraumatic. No masses, or scars. Eyes: Pupils are equal, and reactive to light. Vision is grossly intact. No spontaneous or gaze nystagmus. Ears: Ear canals are clear. Tympanic membranes are intact, with normal landmarks and the middle ears are clear and healthy. Hearing: Grossly normal. Nose: Nasal cavities are clear with healthy mucosa, no polyps or exudate.Airways are patent. Face: No masses or scars, facial nerve function is symmetric. Oral Cavity: No mucosal abnormalities are noted. Tongue with normal mobility. Dentition appears healthy. Oropharynx: Tonsils are symmetric. There are no mucosal masses identified. Tongue base appears normal and healthy. Nasopharynx appears by intranasal exam with an otoscope. Larynx/Hypopharynx: deferred Chest: Deferred Neck: No palpable masses, no cervical adenopathy, no thyroid nodules or enlargement. Neuro: Cranial nerves II-XII will normal function. Balance: Normal gate. Other findings: none.  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 I have reviewed the CT scans. Information is limited based on the fact that they were head CTs and do not give full detail of the sinus anatomy. Recommend a dedicated sinus CT scan to  further evaluate this. We will discuss the possible need for biopsy following that.

## 2016-08-29 NOTE — Anesthesia Preprocedure Evaluation (Signed)
Anesthesia Evaluation  Patient identified by MRN, date of birth, ID band Patient awake    Airway Mallampati: II  TM Distance: >3 FB Neck ROM: Full    Dental  (+) Teeth Intact   Pulmonary neg pulmonary ROS,    breath sounds clear to auscultation       Cardiovascular hypertension,  Rhythm:Regular Rate:Normal     Neuro/Psych CVA    GI/Hepatic   Endo/Other  diabetes  Renal/GU      Musculoskeletal   Abdominal   Peds  Hematology   Anesthesia Other Findings   Reproductive/Obstetrics                             Anesthesia Physical Anesthesia Plan  ASA: III  Anesthesia Plan: General   Post-op Pain Management:    Induction: Intravenous  Airway Management Planned: Oral ETT  Additional Equipment:   Intra-op Plan:   Post-operative Plan: Extubation in OR  Informed Consent: I have reviewed the patients History and Physical, chart, labs and discussed the procedure including the risks, benefits and alternatives for the proposed anesthesia with the patient or authorized representative who has indicated his/her understanding and acceptance.   Dental advisory given  Plan Discussed with: CRNA  Anesthesia Plan Comments:         Anesthesia Quick Evaluation

## 2016-08-29 NOTE — Discharge Instructions (Signed)
Use nasal saline spray about 10 or 15 times each day in both sides. Call if there is any heavy bleeding. Avoid any strenuous activity or bending over for about 2 weeks.  Start back on Plavix on Saturday.

## 2016-08-29 NOTE — Interval H&P Note (Signed)
History and Physical Interval Note:  08/29/2016 12:55 PM  Claudia Jenkins  has presented today for surgery, with the diagnosis of SPHENOID AND CLIVUS MASS  The various methods of treatment have been discussed with the patient and family. After consideration of risks, benefits and other options for treatment, the patient has consented to  Procedure(s): NASAL ENDOSCOPY WITH BIOPSY OF SPHENOID AND CLIVIUS MASS (N/A) as a surgical intervention .  The patient's history has been reviewed, patient examined, no change in status, stable for surgery.  I have reviewed the patient's chart and labs.  Questions were answered to the patient's satisfaction.     Gerard Bonus

## 2016-08-29 NOTE — Op Note (Signed)
OPERATIVE REPORT  DATE OF SURGERY: 08/29/2016  PATIENT:  Claudia Jenkins,  71 y.o. female  PRE-OPERATIVE DIAGNOSIS:  SPHENOID AND CLIVUS MASS  POST-OPERATIVE DIAGNOSIS:  SPHENOID AND CLIVUS MASS  PROCEDURE:  Procedure(s): NASAL ENDOSCOPY WITH BIOPSY OF SPHENOID AND CLIVIUS MASS  SURGEON:  Beckie Salts, MD  ASSISTANTS: None  ANESTHESIA:   General   EBL:  20 ml  DRAINS: None  LOCAL MEDICATIONS USED:  2% Xylocaine with epinephrine  SPECIMEN:  Bilateral sphenoid sinus mass, frozen section and permanent pathologic evaluation.  COUNTS:  Correct  PROCEDURE DETAILS: The patient was taken to the operating room and placed on the operating table in the supine position. Following induction of oral endotracheal anesthesia, the nose was prepped and draped in a standard fashion. Afrin pledgets were used bilaterally. 2% Xylocaine with epinephrine was infiltrated into the posterior attachments of the middle turbinates bilaterally and using a spinal needle into the sphenoid rostrum. The sphenoid ostia was widely patent bilaterally. This was enlarged bilaterally using down-biting Kerrison rongeurs. The 0 and 30 endoscope was used to inspect inside the sphenoid on both sides. Both sides seemed to contain a soft tissue mass arising from the posterior inferior aspect. Pieces of this were taken on both sides and sent initially for frozen section evaluation and then for permanent pathology. 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 Both sphenoid sinuses were packed with small pieces of a nasal pore dressing. There is no complications and no significant bleeding. Patient was awakened estimated and transferred to recovery in stable condition.    PATIENT DISPOSITION:  To PACU, stable

## 2016-08-30 ENCOUNTER — Encounter (HOSPITAL_COMMUNITY): Payer: Self-pay | Admitting: Otolaryngology

## 2016-08-31 NOTE — Anesthesia Postprocedure Evaluation (Addendum)
Anesthesia Post Note  Patient: Claudia Jenkins  Procedure(s) Performed: Procedure(s) (LRB): NASAL ENDOSCOPY WITH BIOPSY OF SPHENOID AND CLIVIUS MASS (N/A)  Patient location during evaluation: PACU Anesthesia Type: General Level of consciousness: awake and sedated Pain management: pain level controlled Vital Signs Assessment: post-procedure vital signs reviewed and stable Respiratory status: spontaneous breathing, nonlabored ventilation, respiratory function stable and patient connected to nasal cannula oxygen Cardiovascular status: blood pressure returned to baseline and stable Postop Assessment: no signs of nausea or vomiting Anesthetic complications: no       Last Vitals:  Vitals:   08/29/16 1530 08/29/16 1532  BP:  (!) 146/62  Pulse: 78 78  Resp: 18 18  Temp: 36.6 C     Last Pain:  Vitals:   08/29/16 1532  TempSrc:   PainSc: 0-No pain                 Tajah Schreiner,JAMES TERRILL

## 2016-09-11 ENCOUNTER — Encounter (HOSPITAL_COMMUNITY): Payer: Self-pay

## 2016-09-17 ENCOUNTER — Encounter: Payer: Self-pay | Admitting: Physical Therapy

## 2016-09-17 ENCOUNTER — Ambulatory Visit: Payer: 59 | Attending: Internal Medicine | Admitting: Physical Therapy

## 2016-09-17 VITALS — BP 153/72 | HR 73

## 2016-09-17 DIAGNOSIS — I69354 Hemiplegia and hemiparesis following cerebral infarction affecting left non-dominant side: Secondary | ICD-10-CM | POA: Insufficient documentation

## 2016-09-17 NOTE — Therapy (Signed)
[facility] [address] [place], [place], [address] Phone: [phone]   Fax:  [phone]  Physical Therapy Evaluation and Discharge  Patient Details  Name: Claudia Jenkins MRN: [id number] Date of Birth: [birth year] Referring Provider: Dr. Ardeth Perfect  Encounter Date: 09/17/2016      PT End of Session - 09/17/16 2011    Visit Number 1   Number of Visits 1   PT Start Time 0840   PT Stop Time 0935   PT Time Calculation (min) 55 min   Activity Tolerance Patient tolerated treatment well   Behavior During Therapy Flat affect      Past Medical History:  Diagnosis Date  . Diabetes mellitus without complication ([place])   . High cholesterol   . Hypertension   . Stroke [facility]) 05/2016    Past Surgical History:  Procedure Laterality Date  . BACK SURGERY    . NASAL SINUS SURGERY N/A 08/29/2016   Procedure: NASAL ENDOSCOPY WITH BIOPSY OF SPHENOID AND CLIVIUS MASS;  Surgeon: Izora Gala, MD;  Location: [facility];  Service: ENT;  Laterality: N/A;    Vitals:   09/17/16 0851  BP: (!) 153/72  Pulse: 73         Subjective Assessment - 09/17/16 0902    Subjective Feels like her left arm was most effected by CVA. Decreased sensation; parasthesia. Doesn't notice any change in her vision. Did recently try to wear new glasses (after 30+ yrs of contacts) and walls looked like they were curving.    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 Ardeth Perfect   Onset Date/Surgical Date 06/21/16   Hand Dominance Right   Prior Therapy none     Precautions   Precautions None     Balance Screen   Has the patient fallen  in the past 6 months No   Has the patient had a decrease in activity level because of a fear of falling?  No   Is the patient reluctant to leave their home because of a fear of falling?  No     Home Environment   Living Environment Private residence   Living Arrangements Spouse/significant other   Type of [place] to enter   Entrance Stairs-Number of Steps 3   Entrance Stairs-Rails Right   Home Layout Two level;Able to live on main level with bedroom/bathroom   Home Equipment Shower seat - built in     Prior Function   Level of [place] Retired   Leisure travel,      Associate Professor   Overall Cognitive Status Within Functional Limits for tasks assessed  doesn't feel she's as emotional/grateful as she should be   Behaviors Other (comment)  flat affect     Observation/Other Assessments   Observations ambulates into clinic with no device   Focus on Therapeutic Outcomes (FOTO)  unable to log in     Observation/Other Assessments-Edema    Edema --  none     Sensation   Light Touch Appears Intact   Stereognosis Appears Intact  named "stick" for pen with each hand   Hot/Cold Appears Intact   Additional Comments describes parasthesias LUE from shoulder to wrist; hard, dead, uncomfortable if anything touches LUE     Coordination   Fine Motor Movements are Fluid and Coordinated No  difficulty with thumb to each finger tip   Finger Nose Finger Test some difficulty in Lt quadrants and when using LUE compared to RUE   Heel Shin Test WNL     Posture/Postural Control   Posture/Postural Control No significant limitations     ROM / Strength   AROM / PROM / Strength Strength     Strength   Overall Strength Deficits   Overall Strength Comments bil knee extension, ankle DF 5/5; bil knee flexion 4/5; LUE grossly 4+/5 except grip significantly decreased     Transfers   Transfers Sit to Stand   Sit to Stand 7: Independent   Five time sit  to stand comments  11.09     Ambulation/Gait   Ambulation/Gait Yes   Ambulation/Gait Assistance 7: Independent   Ambulation Distance (Feet) 100 Feet  40, 20, 20, 100   Assistive device None   Gait Pattern Within Functional Limits   Ambulation Surface Level;Indoor   Gait velocity 3.55 ft/sec   Stairs --  pt reports no difficulties with indoor/outdoor steps @ home     Standardized Balance Assessment   Standardized Balance Assessment Timed Up and Go Test     Timed Up and Go Test   Normal TUG (seconds) 8     High Level Balance   High Level Balance Comments SLS 4 sec on Rt; 10 sec on Lt; tandem 20 sec                           PT Education - 09/17/16 09-17-2007    Education provided Yes   Education Details results of PT evaluation; recommendation for OT evaluation; desensitization of LUE with different textures (lotion, smooth fabric progress to rough fabric) and using LUE for as many tasks as able    Person(s) Educated Patient   Methods Explanation;Demonstration   Comprehension Verbalized understanding                    Plan - 09/17/16 2010-09-17    Clinical Impression Statement Patient presented for PT evaluation s/p watershed Rt CVA 06/21/16 while vacationing in [place]. She reports primary difficulty with LUE parasthesias. She denies visual changes, however neurologist note indicates LLQ anopsia. Patient with changes in LUE sensation, strength, and coordination. Her gait velocity of 3.55 ft/sec exceeds the normative data for women 69-69 yo (2.85 ft/sec) and TUG score of 8.0 is WNL. No further PT indicated at this time, however would recommend an OT evaluation to further assess LUE and vision deficits. Patient agreeable to pursuing OT referral from PCP.    Recommended Other Services OT evaluation   Consulted and Agree with Plan of Care Patient      Patient will benefit from skilled therapeutic intervention in order to improve the following deficits and  impairments:     Visit Diagnosis: Hemiplegia and hemiparesis following cerebral infarction affecting left non-dominant side ([place]) - Plan: PT plan of care cert/re-cert      G-Codes - 32/67/12 Sep 16, 2016    Functional Assessment Tool Used (Outpatient Only) TUG 8.0 seconds; gait velocity 3.55 ft/sec   Functional Limitation Mobility: Walking and moving around   Mobility: Walking and Moving Around Current Status (  [id number]) 0 percent impaired, limited or restricted   Mobility: Walking and Moving Around Goal Status [phone]) 0 percent impaired, limited or restricted   Mobility: Walking and Moving Around Discharge Status [phone]) 0 percent impaired, limited or restricted       Problem List Patient Active Problem List   Diagnosis Date Noted  . Essential hypertension 07/17/2016  . Diabetes mellitus ([place]) 07/17/2016  . Mixed hyperlipidemia 07/17/2016  . 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 CVA (cerebral vascular accident) ([place]) 07/16/2016    Rexanne Mano, PT 09/17/2016, 8:51 PM  [facility] [address] [place], [place], [address] Phone: [phone]   Fax:  [phone]  Name: ADRIANN THAU MRN: [id number] Date of Birth: 1946-05-06

## 2016-09-25 ENCOUNTER — Ambulatory Visit: Payer: 59 | Attending: Internal Medicine | Admitting: Occupational Therapy

## 2016-09-25 DIAGNOSIS — R41842 Visuospatial deficit: Secondary | ICD-10-CM | POA: Insufficient documentation

## 2016-09-25 DIAGNOSIS — I69315 Cognitive social or emotional deficit following cerebral infarction: Secondary | ICD-10-CM | POA: Insufficient documentation

## 2016-09-25 DIAGNOSIS — M6281 Muscle weakness (generalized): Secondary | ICD-10-CM | POA: Insufficient documentation

## 2016-09-25 DIAGNOSIS — I69354 Hemiplegia and hemiparesis following cerebral infarction affecting left non-dominant side: Secondary | ICD-10-CM | POA: Insufficient documentation

## 2016-09-25 DIAGNOSIS — R208 Other disturbances of skin sensation: Secondary | ICD-10-CM | POA: Diagnosis present

## 2016-09-25 DIAGNOSIS — R278 Other lack of coordination: Secondary | ICD-10-CM | POA: Diagnosis present

## 2016-09-25 NOTE — Therapy (Signed)
[facility] [address] [place], [place], [address] Phone: [phone]   Fax:  [phone]  Occupational Therapy Evaluation  Patient Details  Name: Claudia Jenkins MRN: [id number] Date of Birth: 30-May-1945 Referring Provider: Dr. Ardeth Perfect  Encounter Date: 09/25/2016      OT End of Session - 09/25/16 1454    Visit Number 1   Number of Visits 13   Date for OT Re-Evaluation 11/23/16   Authorization Type [employer]/ [facility]   Authorization Time Period 09/25/16-11/23/16   Authorization - Visit Number 1   Authorization - Number of Visits 10   OT Start Time 5400   OT Stop Time 1445   OT Time Calculation (min) 40 min   Activity Tolerance Patient tolerated treatment well   Behavior During Therapy Flat affect      Past Medical History:  Diagnosis Date  . Diabetes mellitus without complication ([place])   . High cholesterol   . Hypertension   . Stroke [facility]) 05/2016    Past Surgical History:  Procedure Laterality Date  . BACK SURGERY    . NASAL SINUS SURGERY N/A 08/29/2016   Procedure: NASAL ENDOSCOPY WITH BIOPSY OF SPHENOID AND CLIVIUS MASS;  Surgeon: Izora Gala, MD;  Location: [employer];  Service: ENT;  Laterality: N/A;    There were no vitals filed for this visit.      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 PMH significant for DM II, HTN, hyperlipidemia, back surgery.    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 PMH significant for DM II, HTN, hyperlipidemia, back surgery.    Patient Stated Goals decreased sensitivity in LUE   Currently in Pain? Yes   Pain Score 8    Pain Location Arm   Pain Descriptors / Indicators Squeezing   Pain Type Acute pain   Pain Onset More than a month ago   Pain Frequency Constant   Aggravating Factors  hard surfaces   Pain Relieving Factors rest   Effect of Pain on Daily Activities limits use           [facility] OT Assessment - 09/25/16 1412      Assessment   Diagnosis R watershed CVA   Referring Provider Dr. Ardeth Perfect   Onset Date --  January 2018     Precautions   Precautions Other (comment)   Precaution Comments sensory impairment in LUE     Balance Screen   Has the patient fallen in the past 6 months No   Has the patient had a decrease in activity level because of a fear of falling?  No   Is the patient reluctant to leave their home because of a fear of falling?  No     Home  Environment   Family/patient expects to be discharged to: Private residence   Living Arrangements Spouse/significant other   Type of [place] to live on main level with bedroom/bathroom   Bathroom Building control surveyor   Lives With Spouse     Prior Function   Level of Independence Independent   Vocation Retired   Biomedical scientist retired Copywriter, advertising   Leisure travel     ADL   Eating/Feeding Independent  Grooming Independent   Upper Body Bathing Modified independent   Lower Body Bathing Modified independent   Upper Body Dressing Independent   Lower Body Dressing Independent   Toilet Tranfer Independent   Tub/Shower Transfer Independent   ADL comments Difficulty putting in left earring     IADL   Shopping Needs to be accompanied on any shopping trip   Light Housekeeping Performs light daily tasks such as dishwashing, bed making   Meal Prep --  Pt is not cooking currently   Medication Management Is responsible for taking medication in correct dosages at correct time   Financial Management Manages financial matters independently (budgets, writes checks, pays rent, bills goes to bank), collects and keeps track of income     Mobility   Mobility Status Independent     Written Expression   Dominant Hand  Right     Vision - History   Baseline Vision Wears contact   Visual History Other (comment)   Patient Visual Report --  Pt reports switching back to contacts from glasses   Additional Comments Pt reports visual changes after switch from glasses to contacts     Vision Assessment   Eye Alignment Impaired (comment)   Vision Assessment Vision impaired  _ to be further tested in functional context   Alignment/Gaze Preference --  decreased alignment   Visual Fields --  Appears in tact by gross assessment   Comment Therapist to further assess in functional context, Pt reports her eye doctor noted bilateral visual field changes     Activity Tolerance   Activity Tolerance --  Pt tolerates grossly 30 mins of activity prior to rest,      Cognition   Overall Cognitive Status Within Functional Limits for tasks assessed   Behaviors Other (comment)     Observation/Other Assessments   Observations --  flat affect   Focus on Therapeutic Outcomes (FOTO)  not captured     Sensation   Light Touch Appears Intact   Additional Comments describes parasthesias LUE from shoulder to wrist; hard, dead, uncomfortable if anything touches LUE     Coordination   Fine Motor Movements are Fluid and Coordinated No   9 Hole Peg Test Right;Left   Right 9 Hole Peg Test 20.25 secs   Left 9 Hole Peg Test 37.59 secs     ROM / Strength   AROM / PROM / Strength Strength     Strength   Overall Strength Deficits   Overall Strength Comments RUE strength 5/5, LUE 4+/5     Hand Function   Right Hand Grip (lbs) 65 lbs   Left Hand Grip (lbs) 55 lbs                           OT Short Term Goals - 09/25/16 1520      OT SHORT TERM GOAL #1   Title I with HEP for coordination and LUE strength   Time 3   Period Weeks   Status New     OT SHORT TERM GOAL #2   Title I with desensitization techniques   Time 3   Period Weeks   Status New     OT SHORT TERM GOAL #3   Title further assess  vision in functional context and set goal PRN   Time 3   Period Weeks   Status New           OT Long Term Goals - 09/25/16  [place] #1   Title Pt will perform mod complex home management/ cooking modified independently demonstrating good safety awareness.   Time 6   Period Weeks   Status New     OT LONG TERM GOAL #2   Title Pt will report ability to use LUE as a non dominant assist at least 75% of the time with pain less than or equal to 4/10.   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 PMH significant for DM II, HTN, hyperlipidemia, back surgery. Pt presents with decreased LUE coordination, strength, hypersensitivity and decreased activity tolerance. Pt can benefit from skilled occupational therapy to maximize safety and independence with ADLs/ IADLs and to maintain quality of life.   Rehab Potential Fair   OT Frequency 2x / week  plus eval   OT Duration 6 weeks   OT Treatment/Interventions Self-care/ADL training;Moist Heat;Fluidtherapy;DME and/or AE instruction;Patient/family education;Therapeutic exercises;Ultrasound;Therapeutic exercise;Therapeutic activities;Passive range of motion;Neuromuscular education;Cryotherapy;Parrafin;Energy conservation;Electrical Stimulation;Manual Therapy   Plan  schedule more visits,progress desensitization, HEP for LUE coordination    Consulted and Agree with Plan of Care Patient      Patient will benefit from skilled therapeutic intervention in order to improve the following deficits and impairments:  Pain,  Impaired sensation, Decreased coordination, Decreased activity tolerance, Decreased endurance, Decreased strength, Impaired UE functional use, Impaired perceived functional ability, Decreased safety awareness, Decreased knowledge of precautions  Visit Diagnosis: Muscle weakness (generalized) - Plan: Ot plan of care cert/re-cert  Other lack of coordination - Plan: Ot plan of care cert/re-cert  Visuospatial deficit - Plan: Ot plan of care cert/re-cert  Other disturbances of skin sensation - Plan: Ot plan of care cert/re-cert      G-Codes - 54/09/81 1511    Functional Assessment Tool Used (Outpatient only) 9 hole peg test RUE 20.25 secs, LUE 37.59 secs   Functional Limitation Carrying, moving and handling objects   Carrying, Moving and Handling Objects Current Status (X9147) At least 20 percent but less than 40 percent impaired, limited or restricted   Carrying, Moving and Handling Objects Goal Status (W2956) At least 1 percent but less than 20 percent impaired, limited or restricted      Problem List Patient Active Problem List   Diagnosis Date Noted  . Essential hypertension 07/17/2016  . Diabetes mellitus ([place]) 07/17/2016  . Mixed hyperlipidemia 07/17/2016  . 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 CVA (cerebral vascular accident) ([place]) 07/16/2016    Claudia Jenkins 09/25/2016, 3:34 PM Theone Murdoch, OTR/L [phone] Phone: [phone] 3:34 PM 09/25/16 [facility] [address] [place], [place], [address] Phone: [phone]   Fax:  [phone]  Name: Claudia Jenkins MRN: [id number] Date of Birth: 1946/03/10

## 2016-09-25 NOTE — Patient Instructions (Signed)
Rub your arm or have your husband rub your left arm with lotion with long strokes then in a circular motion 5 mins 1-2 x day. This will help desensitize your left arm. Progress to rubbing your left arm and hand with a soft washcloth

## 2016-09-27 ENCOUNTER — Ambulatory Visit: Payer: 59 | Admitting: Occupational Therapy

## 2016-09-27 DIAGNOSIS — R41842 Visuospatial deficit: Secondary | ICD-10-CM

## 2016-09-27 DIAGNOSIS — R278 Other lack of coordination: Secondary | ICD-10-CM

## 2016-09-27 DIAGNOSIS — I69315 Cognitive social or emotional deficit following cerebral infarction: Secondary | ICD-10-CM

## 2016-09-27 DIAGNOSIS — M6281 Muscle weakness (generalized): Secondary | ICD-10-CM

## 2016-09-27 DIAGNOSIS — R208 Other disturbances of skin sensation: Secondary | ICD-10-CM

## 2016-09-27 NOTE — Patient Instructions (Signed)
  Coordination Activities  Perform the following activities for 20 minutes 1 times per day with left hand(s).   Rotate ball in fingertips (clockwise and counter-clockwise).  Toss ball between hands.  Toss ball in air and catch with the same hand.  Deal cards with your thumb (Hold deck in hand and push card off top with thumb).  Pick up coins, buttons, marbles, dried beans/pasta of different sizes and place in container.  Pick up coins and place in container or coin bank.  Pick up coins and stack.  Pick up coins one at a time until you get 5-10 in your hand, then move coins from palm to fingertips to stack one at a time.

## 2016-09-27 NOTE — Therapy (Signed)
[facility] [address] [place], [place], [address] Phone: [phone]   Fax:  [phone]  Occupational Therapy Treatment  Patient Details  Name: Claudia Jenkins MRN: [id number] Date of Birth: [birth year] Referring Provider: Dr. Ardeth Perfect  Encounter Date: 09/27/2016      OT End of Session - 09/27/16 1613    Visit Number 2   Number of Visits 17  duration changed   Date for OT Re-Evaluation 11/23/16   Authorization Type [place] Time Period 09/27/16-11/25/16   Authorization - Visit Number 1   Authorization - Number of Visits 10   OT Start Time 1450   OT Stop Time 1532   OT Time Calculation (min) 42 min   Activity Tolerance Patient tolerated treatment well   Behavior During Therapy Flat affect      Past Medical History:  Diagnosis Date  . Diabetes mellitus without complication ([place])   . High cholesterol   . Hypertension   . Stroke [facility]) 05/2016    Past Surgical History:  Procedure Laterality Date  . BACK SURGERY    . NASAL SINUS SURGERY N/A 08/29/2016   Procedure: NASAL ENDOSCOPY WITH BIOPSY OF SPHENOID AND CLIVIUS MASS;  Surgeon: Izora Gala, MD;  Location: [facility];  Service: ENT;  Laterality: N/A;    There were no vitals filed for this visit.      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 PMH significant for DM II, HTN, hyperlipidemia, back surgery.    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 PMH significant for DM II, HTN, hyperlipidemia, back surgery.    Patient Stated Goals decreased sensitivity in LUE   Currently in Pain? Yes   Pain Score 5    Pain Location Arm   Pain Orientation Left   Pain Descriptors / Indicators Aching   Pain Type Acute pain   Pain Onset More than a  month ago   Pain Frequency Constant   Aggravating Factors  touching it   Pain Relieving Factors rest   Effect of Pain on Daily Activities limits use          Treatment: environmental scanning: Pt missed 5/14 cards on first pass, pt located 3 more on second pass and located final 2 on 3rd pass. Therapist discussed that this would impact pt's ability to drive safely as she is not scanning effectively. 1.5 M number cancellation with only 2 omitted Trailmaking B: Pt required v.c. To start correct sequence then she later made errors in correct sequence.Pt demonstrates decreased alternating attention.                        OT Education - 09/27/16 1623    Education provided Yes   Education Details coordination HEP, importance of avoiding compensations   Person(s) Educated Patient   Methods Explanation;Demonstration;Verbal cues   Comprehension Verbalized understanding;Returned demonstration;Verbal cues required          OT Short Term Goals - 09/27/16 1608      OT SHORT TERM GOAL #1   Title I with HEP for coordination and LUE strength   Time 4   Period Weeks   Status New     OT SHORT TERM GOAL #2  Title I with desensitization techniques   Time 4   Period Weeks   Status New     OT SHORT TERM GOAL #3   Title Pt will demonstrate ability to perform basic environmental scanning with 90% or better accuracy in order to safetly navigate a busy environment..   Time 4   Period Weeks   Status New           OT Long Term Goals - 09/27/16 1609      OT LONG TERM GOAL #1   Title Pt will perform mod complex home management/ cooking modified independently demonstrating good safety awareness.   Time 8   Period Weeks   Status New     OT LONG TERM GOAL #2   Title Pt will report ability to use LUE as a non dominant assist at least 75% of the time with pain less than or equal to 4/10.   Time 8   Period Weeks   Status New     OT LONG TERM GOAL #3   Title Pt will  demonstrate improved fine motor coordination as evidenced by decreasing 9 hole peg test score to 34 secs or less with LUE   Baseline 37.59   Time 8   Period Weeks   Status New     OT LONG TERM GOAL #4   Title Pt will demonstrate ability to perform a physical and cognitive task simultaneously with 90% or better accuracy.   Time 8   Period Weeks   Status New     OT LONG TERM GOAL #5   Title Pt will perfom complex visual scanning with 90% or better accuracy in prep for driving.   Time 8   Period Weeks   Status New               Plan - 09/27/16 1604    Clinical Impression Statement Pt demonstrates difficulties with alternating attention and environmental scanning. Pt can benefit from skilled occupational therapy to address.   Rehab Potential Fair   OT Frequency 2x / week  plus eval   OT Duration 8 weeks  may be able to d/c after 6 weeks dependening on progress   OT Treatment/Interventions Self-care/ADL training;Moist Heat;Fluidtherapy;DME and/or AE instruction;Patient/family education;Therapeutic exercises;Ultrasound;Therapeutic exercise;Therapeutic activities;Passive range of motion;Neuromuscular education;Cryotherapy;Parrafin;Energy conservation;Electrical Stimulation;Manual Therapy   Plan Desensitization LUE, consider ball seated,putty exercises,    Consulted and Agree with Plan of Care Patient      Patient will benefit from skilled therapeutic intervention in order to improve the following deficits and impairments:  Pain, Impaired sensation, Decreased coordination, Decreased activity tolerance, Decreased endurance, Decreased strength, Impaired UE functional use, Impaired perceived functional ability, Decreased safety awareness, Decreased knowledge of precautions  Visit Diagnosis: Muscle weakness (generalized) - Plan: Ot plan of care cert/re-cert  Other lack of coordination - Plan: Ot plan of care cert/re-cert  Visuospatial deficit - Plan: Ot plan of care  cert/re-cert  Other disturbances of skin sensation - Plan: Ot plan of care cert/re-cert  Cognitive social or emotional deficit following cerebral infarction - Plan: Ot plan of care cert/re-cert    Problem List Patient Active Problem List   Diagnosis Date Noted  . Essential hypertension 07/17/2016  . Diabetes mellitus ([place]) 07/17/2016  . Mixed hyperlipidemia 07/17/2016  . 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 CVA (cerebral vascular accident) ([place]) 07/16/2016    Bexley Laubach 09/27/2016, 4:29 PM  [facility] [address] [place], [place], [address] Phone: [phone]  Fax:  [phone]  Name: Claudia Jenkins MRN: [id number] Date of Birth: [birth year]

## 2016-10-02 ENCOUNTER — Ambulatory Visit: Payer: 59 | Admitting: Occupational Therapy

## 2016-10-02 DIAGNOSIS — R208 Other disturbances of skin sensation: Secondary | ICD-10-CM

## 2016-10-02 DIAGNOSIS — R41842 Visuospatial deficit: Secondary | ICD-10-CM

## 2016-10-02 DIAGNOSIS — I69354 Hemiplegia and hemiparesis following cerebral infarction affecting left non-dominant side: Secondary | ICD-10-CM

## 2016-10-02 DIAGNOSIS — M6281 Muscle weakness (generalized): Secondary | ICD-10-CM

## 2016-10-02 DIAGNOSIS — R278 Other lack of coordination: Secondary | ICD-10-CM

## 2016-10-02 NOTE — Patient Instructions (Addendum)
1. Grip Strengthening (Resistive Putty)   Squeeze putty using thumb and all fingers. Repeat 15 times. Do 1 sessions per day.   Extension (Assistive Putty)   Roll putty back and forth, being sure to use all fingertips. Repeat 3 times. Do 1 sessions per day.  Then pinch as below.   Palmar Pinch Strengthening (Resistive Putty)   Pinch putty between thumb and each fingertip in turn after rolling out    Finger and Thumb Extension (Resistive Putty)   With thumb and all fingers in center of putty donut, stretch out. Repeat 5-10 times. Do 1 sessions per day.      Place beads/coins/buttons in putty and then try to remove with only your left hand.    Chest Pass (Standing)    Sit, holding  ball. Start with elbows bent by your side and shoulder blades pulled back and down.  Straighten elbows to move ball forward. Repeat 10 times.  Do 2 times per day.       Sit and Hold a ball with arms straight. Slowly move arms up as far as you can without pain, keeping elbows straight. Repeat  10 times per set. Do 2 sets per session.

## 2016-10-02 NOTE — Therapy (Signed)
[facility] [address] [place], [place], [address] Phone: [phone]   Fax:  [phone]  Occupational Therapy Treatment  Patient Details  Name: Claudia Jenkins MRN: [id number] Date of Birth: [birth year] Referring Provider: Dr. Ardeth Perfect  Encounter Date: 10/02/2016      OT End of Session - 10/02/16 0812    Visit Number 3   Number of Visits 17  duration changed   Date for OT Re-Evaluation 11/23/16   Authorization Type [place] Time Period 09/27/16-11/25/16   Authorization - Visit Number 3   Authorization - Number of Visits 10   OT Start Time 0808   OT Stop Time 0846   OT Time Calculation (min) 38 min   Activity Tolerance Patient tolerated treatment well   Behavior During Therapy Flat affect;WFL for tasks assessed/performed      Past Medical History:  Diagnosis Date  . Diabetes mellitus without complication ([place])   . High cholesterol   . Hypertension   . Stroke [facility]) 05/2016    Past Surgical History:  Procedure Laterality Date  . BACK SURGERY    . NASAL SINUS SURGERY N/A 08/29/2016   Procedure: NASAL ENDOSCOPY WITH BIOPSY OF SPHENOID AND CLIVIUS MASS;  Surgeon: Izora Gala, MD;  Location: [facility];  Service: ENT;  Laterality: N/A;    There were no vitals filed for this visit.      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 PMH significant for DM II, HTN, hyperlipidemia, back surgery.    Patient Stated Goals decreased sensitivity in LUE   Currently in Pain? Yes   Pain Score --  Not true pain per pt    Pain Location Arm   Pain Descriptors / Indicators Heaviness;Squeezing;Numbness   Pain Type Acute pain   Pain Onset More than a month ago   Pain Frequency Constant   Aggravating Factors   touching hard surfaces   Pain Relieving Factors rest        Dealing cards with thumb with min difficulty for incr coordination.                      OT Education - 10/02/16 0816    Education Details Red putty HEP; Diona Foley ex in sitting--see pt instructions   Person(s) Educated Patient   Methods Explanation;Demonstration;Verbal cues;Handout   Comprehension Verbalized understanding;Returned demonstration;Verbal cues required          OT Short Term Goals - 09/27/16 1608      OT SHORT TERM GOAL #1   Title I with HEP for coordination and LUE strength   Time 4   Period Weeks   Status New     OT SHORT TERM GOAL #2   Title I with desensitization techniques   Time 4   Period Weeks   Status New     OT SHORT TERM GOAL #3   Title Pt will demonstrate ability to perform basic environmental scanning with 90% or better accuracy in order to safetly navigate a busy environment..   Time 4   Period Weeks   Status New           OT Long Term Goals - 09/27/16 1609      OT LONG TERM  GOAL #1   Title Pt will perform mod complex home management/ cooking modified independently demonstrating good safety awareness.   Time 8   Period Weeks   Status New     OT LONG TERM GOAL #2   Title Pt will report ability to use LUE as a non dominant assist at least 75% of the time with pain less than or equal to 4/10.   Time 8   Period Weeks   Status New     OT LONG TERM GOAL #3   Title Pt will demonstrate improved fine motor coordination as evidenced by decreasing 9 hole peg test score to 34 secs or less with LUE   Baseline 37.59   Time 8   Period Weeks   Status New     OT LONG TERM GOAL #4   Title Pt will demonstrate ability to perform a physical and cognitive task simultaneously with 90% or better accuracy.   Time 8   Period Weeks   Status New     OT LONG TERM GOAL #5   Title Pt will perfom complex visual scanning with 90% or better accuracy in prep for driving.   Time 8    Period Weeks   Status New               Plan - 10/02/16 0813    Clinical Impression Statement Pt is progressing towards goals and verbalizes understanding of education given.   Rehab Potential Fair   OT Frequency 2x / week  plus eval   OT Duration 8 weeks  may be able to d/c after 6 weeks dependening on progress   OT Treatment/Interventions Self-care/ADL training;Moist Heat;Fluidtherapy;DME and/or AE instruction;Patient/family education;Therapeutic exercises;Ultrasound;Therapeutic exercise;Therapeutic activities;Passive range of motion;Neuromuscular education;Cryotherapy;Parrafin;Energy conservation;Electrical Stimulation;Manual Therapy   Plan desentization LUE, review HEPs prn, environmental scanning   Consulted and Agree with Plan of Care Patient      Patient will benefit from skilled therapeutic intervention in order to improve the following deficits and impairments:  Pain, Impaired sensation, Decreased coordination, Decreased activity tolerance, Decreased endurance, Decreased strength, Impaired UE functional use, Impaired perceived functional ability, Decreased safety awareness, Decreased knowledge of precautions  Visit Diagnosis: Muscle weakness (generalized)  Other lack of coordination  Visuospatial deficit  Other disturbances of skin sensation  Hemiplegia and hemiparesis following cerebral infarction affecting left non-dominant side [facility])    Problem List Patient Active Problem List   Diagnosis Date Noted  . Essential hypertension 07/17/2016  . Diabetes mellitus ([place]) 07/17/2016  . Mixed hyperlipidemia 07/17/2016  . 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 CVA (cerebral vascular accident) ([place]) 07/16/2016    [facility] 10/02/2016, 1:11 PM  [facility] [address] [place], [place], [address] Phone: [phone]   Fax:  [phone]  Name: Claudia Jenkins MRN: [id number] Date of Birth: 12-31-1945    Vianne Bulls, OTR/L [facility] [address] [place], [place]  [address] [phone] phone [phone] 10/02/16 1:12 PM

## 2016-10-09 ENCOUNTER — Ambulatory Visit: Payer: 59 | Admitting: Occupational Therapy

## 2016-10-09 DIAGNOSIS — R208 Other disturbances of skin sensation: Secondary | ICD-10-CM

## 2016-10-09 DIAGNOSIS — I69354 Hemiplegia and hemiparesis following cerebral infarction affecting left non-dominant side: Secondary | ICD-10-CM

## 2016-10-09 DIAGNOSIS — R41842 Visuospatial deficit: Secondary | ICD-10-CM

## 2016-10-09 DIAGNOSIS — M6281 Muscle weakness (generalized): Secondary | ICD-10-CM

## 2016-10-09 DIAGNOSIS — I69315 Cognitive social or emotional deficit following cerebral infarction: Secondary | ICD-10-CM

## 2016-10-09 DIAGNOSIS — R278 Other lack of coordination: Secondary | ICD-10-CM

## 2016-10-09 NOTE — Therapy (Signed)
[facility] [place] [address] [place], [place], [address] Phone: [phone]   Fax:  [phone]  Occupational Therapy Treatment  Patient Details  Name: Claudia Jenkins MRN: [id number] Date of Birth: 04-30-1946 Referring Provider: Dr. Ardeth Perfect  Encounter Date: 10/09/2016      OT End of Session - 10/09/16 1154    Visit Number 4   Number of Visits 17  duration changed   Date for OT Re-Evaluation 11/23/16   Authorization Type [place] Time Period 09/27/16-11/25/16   Authorization - Visit Number 4   Authorization - Number of Visits 10   OT Start Time 4580   OT Stop Time 1232   OT Time Calculation (min) 40 min   Activity Tolerance Patient tolerated treatment well   Behavior During Therapy [facility] for tasks assessed/performed      Past Medical History:  Diagnosis Date  . Diabetes mellitus without complication ([place])   . High cholesterol   . Hypertension   . Stroke [facility]) 05/2016    Past Surgical History:  Procedure Laterality Date  . BACK SURGERY    . NASAL SINUS SURGERY N/A 08/29/2016   Procedure: NASAL ENDOSCOPY WITH BIOPSY OF SPHENOID AND CLIVIUS MASS;  Surgeon: Izora Gala, MD;  Location: [place];  Service: ENT;  Laterality: N/A;    There were no vitals filed for this visit.      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 PMH significant for DM II, HTN, hyperlipidemia, back surgery.    Patient Stated Goals decreased sensitivity in LUE   Currently in Pain? No/denies   Pain Onset More than a month ago        Pulling small objects out of rice bath for desensitization--initially only was able to get larger objects, but then able to begin to feel small objects (although missed multiple objects when they were in  her hand).  Reviewed ball and putty HEP issued last session. Pt returned demo with occasional min v.c. (particularly for chest press with ball due to shoulder compensation).  Environmental scanning in minimally distracting environment with 13/15 items found initially, then additional item found on 2nd pass.  Needed min cueing for last item.  (pt missed on item on each side).  Functional reaching to remove/replace clothespins with 1-8lb resistance including reaching overhead.  For sensory re-training, incr strength, and activity tolerance.   Pt instructed in how to use rice bath at home and how she can progress desensitization including using brush.  Pt verbalized understanding.  Tossing ball between hands and with only L hand and rotating ball with min difficulty for incr coordination.                       OT Short Term Goals - 09/27/16 1608      OT SHORT TERM GOAL #1   Title I with HEP for coordination and LUE strength   Time 4   Period Weeks   Status New     OT SHORT TERM GOAL #2   Title I with desensitization techniques   Time 4   Period Weeks   Status New     OT SHORT TERM GOAL #3   Title Pt will demonstrate ability to  perform basic environmental scanning with 90% or better accuracy in order to safetly navigate a busy environment..   Time 4   Period Weeks   Status New           OT Long Term Goals - 09/27/16 1609      OT LONG TERM GOAL #1   Title Pt will perform mod complex home management/ cooking modified independently demonstrating good safety awareness.   Time 8   Period Weeks   Status New     OT LONG TERM GOAL #2   Title Pt will report ability to use LUE as a non dominant assist at least 75% of the time with pain less than or equal to 4/10.   Time 8   Period Weeks   Status New     OT LONG TERM GOAL #3   Title Pt will demonstrate improved fine motor coordination as evidenced by decreasing 9 hole peg test score to 34 secs or less with LUE    Baseline 37.59   Time 8   Period Weeks   Status New     OT LONG TERM GOAL #4   Title Pt will demonstrate ability to perform a physical and cognitive task simultaneously with 90% or better accuracy.   Time 8   Period Weeks   Status New     OT LONG TERM GOAL #5   Title Pt will perfom complex visual scanning with 90% or better accuracy in prep for driving.   Time 8   Period Weeks   Status New               Plan - 10/09/16 1208    Clinical Impression Statement Pt is progressing towards goals with improving LUE functional use.   Rehab Potential Fair   OT Frequency 2x / week  plus eval   OT Duration 8 weeks  may be able to d/c after 6 weeks dependening on progress   OT Treatment/Interventions Self-care/ADL training;Moist Heat;Fluidtherapy;DME and/or AE instruction;Patient/family education;Therapeutic exercises;Ultrasound;Therapeutic exercise;Therapeutic activities;Passive range of motion;Neuromuscular education;Cryotherapy;Parrafin;Energy conservation;Electrical Stimulation;Manual Therapy   Plan continue with visual scanning, LUE functional use   Consulted and Agree with Plan of Care Patient      Patient will benefit from skilled therapeutic intervention in order to improve the following deficits and impairments:  Pain, Impaired sensation, Decreased coordination, Decreased activity tolerance, Decreased endurance, Decreased strength, Impaired UE functional use, Impaired perceived functional ability, Decreased safety awareness, Decreased knowledge of precautions  Visit Diagnosis: Other lack of coordination  Muscle weakness (generalized)  Other disturbances of skin sensation  Visuospatial deficit  Hemiplegia and hemiparesis following cerebral infarction affecting left non-dominant side (HCC)  Cognitive social or emotional deficit following cerebral infarction    Problem List Patient Active Problem List   Diagnosis Date Noted  . Essential hypertension 07/17/2016   . Diabetes mellitus ([place]) 07/17/2016  . Mixed hyperlipidemia 07/17/2016  . 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 CVA (cerebral vascular accident) [facility]) 07/16/2016    [facility] 10/09/2016, 12:43 PM  [place] [address] [place], [place], [address] Phone: [phone]   Fax:  [phone]  Name: Claudia Jenkins MRN: [id number] Date of Birth: Sep 11, 1945   Vianne Bulls, OTR/L [facility] [address] [place], [place]  [address] [phone] phone [phone] 10/09/16 12:43 PM

## 2016-10-12 ENCOUNTER — Ambulatory Visit: Payer: 59 | Admitting: Occupational Therapy

## 2016-10-12 VITALS — BP 160/90

## 2016-10-12 DIAGNOSIS — M6281 Muscle weakness (generalized): Secondary | ICD-10-CM

## 2016-10-12 DIAGNOSIS — R41842 Visuospatial deficit: Secondary | ICD-10-CM

## 2016-10-12 DIAGNOSIS — I69354 Hemiplegia and hemiparesis following cerebral infarction affecting left non-dominant side: Secondary | ICD-10-CM

## 2016-10-12 DIAGNOSIS — R278 Other lack of coordination: Secondary | ICD-10-CM

## 2016-10-12 DIAGNOSIS — R208 Other disturbances of skin sensation: Secondary | ICD-10-CM

## 2016-10-12 NOTE — Therapy (Signed)
[place] [facility] [address] [place], [place], [address] Phone: [phone]   Fax:  [phone]  Occupational Therapy Treatment  Patient Details  Name: Claudia Jenkins MRN: [id number] Date of Birth: 1945-10-24 Referring Provider: Dr. Ardeth Perfect  Encounter Date: 10/12/2016      OT End of Session - 10/12/16 0846    Visit Number 5   Number of Visits 17   Date for OT Re-Evaluation 11/23/16   Authorization Type [place] Time Period 09/27/16-11/25/16   Authorization - Visit Number 5   Authorization - Number of Visits 10   OT Start Time 0850   OT Stop Time 0930   OT Time Calculation (min) 40 min      Past Medical History:  Diagnosis Date  . Diabetes mellitus without complication ([place])   . High cholesterol   . Hypertension   . Stroke [facility]) 05/2016    Past Surgical History:  Procedure Laterality Date  . BACK SURGERY    . 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 PMH significant for DM II, HTN, hyperlipidemia, back surgery.    Patient Stated Goals decreased sensitivity in LUE   Currently in Pain? No/denies   Pain Score --  Pt reports numbness not pain            Treatment :tPulling small objects out of rice bath for desensitization--initially only was able to get larger objects, pt missed smaller pegs today. Ball ex in seated for closed chain shoulder flexion and chest press, min facilitation/ v.c to avoid compensation. Arm bike x 6 mins level 3 for reciprocal movement/  conditioning Functional mid range reaching in seated to copy small peg design with LUE for visual perceptual skills, coordination and functional reach, min v.c to avoid compensation                     OT Short Term Goals - 10/12/16 0917      OT SHORT TERM GOAL #1   Title I with HEP for coordination and LUE strength- due 10/27/16   Time 4   Period Weeks   Status New     OT SHORT TERM GOAL #2   Title I with desensitization techniques   Time 4   Period Weeks   Status New     OT SHORT TERM GOAL #3   Title Pt will demonstrate ability to perform basic environmental scanning with 90% or better accuracy in order to safetly navigate a busy environment..   Time 4   Period Weeks   Status New           OT Long Term Goals - 10/12/16 0917      OT LONG TERM GOAL #1   Title Pt will perform mod complex home management/ cooking modified independently demonstrating good safety awareness. due 11/25/16   Time 8   Period Weeks   Status New     OT LONG TERM GOAL #2  Title Pt will report ability to use LUE as a non dominant assist at least 75% of the time with pain less than or equal to 4/10.   Time 8   Period Weeks   Status New     OT LONG TERM GOAL #3   Title Pt will demonstrate improved fine motor coordination as evidenced by decreasing 9 hole peg test score to 34 secs or less with LUE   Baseline 37.59   Time 8   Period Weeks   Status New     OT LONG TERM GOAL #4   Title Pt will demonstrate ability to perform a physical and cognitive task simultaneously with 90% or better accuracy.   Time 8   Period Weeks   Status New     OT LONG TERM GOAL #5   Title Pt will perfom complex visual scanning with 90% or better accuracy in prep for driving.   Time 8   Period Weeks   Status New               Plan - 10/12/16 6734    Clinical Impression Statement Pt is progressing towards goals for LUE functional use. She demonstrates decreased hypersensitivity in LUE. Pt  will be on vacation next week.   Rehab Potential Fair   OT Frequency 2x / week   OT Duration 8 weeks   OT Treatment/Interventions Self-care/ADL training;Moist Heat;Fluidtherapy;DME and/or AE instruction;Patient/family education;Therapeutic exercises;Ultrasound;Therapeutic exercise;Therapeutic activities;Passive range of motion;Neuromuscular education;Cryotherapy;Parrafin;Energy conservation;Electrical Stimulation;Manual Therapy   Plan visual scanning, LUE desensitization/ functional use   Consulted and Agree with Plan of Care Patient      Patient will benefit from skilled therapeutic intervention in order to improve the following deficits and impairments:  Pain, Impaired sensation, Decreased coordination, Decreased activity tolerance, Decreased endurance, Decreased strength, Impaired UE functional use, Impaired perceived functional ability, Decreased safety awareness, Decreased knowledge of precautions  Visit Diagnosis: Other lack of coordination  Muscle weakness (generalized)  Other disturbances of skin sensation  Visuospatial deficit  Hemiplegia and hemiparesis following cerebral infarction affecting left non-dominant side [facility])    Problem List Patient Active Problem List   Diagnosis Date Noted  . Essential hypertension 07/17/2016  . Diabetes mellitus ([place]) 07/17/2016  . Mixed hyperlipidemia 07/17/2016  . 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 CVA (cerebral vascular accident) ([place]) 07/16/2016    Claudia Jenkins 10/12/2016, 9:21 AM  [place] [address] [place], [place], [address] Phone: [phone]   Fax:  [phone]  Name: Claudia Jenkins MRN: [id number] Date of Birth: [birth year]

## 2016-10-24 ENCOUNTER — Ambulatory Visit (INDEPENDENT_AMBULATORY_CARE_PROVIDER_SITE_OTHER): Payer: Medicare Other | Admitting: Neurology

## 2016-10-24 ENCOUNTER — Ambulatory Visit: Payer: 59 | Admitting: Occupational Therapy

## 2016-10-24 ENCOUNTER — Encounter: Payer: Self-pay | Admitting: Neurology

## 2016-10-24 VITALS — BP 161/88 | HR 77 | Ht 62.0 in | Wt 163.0 lb

## 2016-10-24 DIAGNOSIS — M6281 Muscle weakness (generalized): Secondary | ICD-10-CM

## 2016-10-24 DIAGNOSIS — R41842 Visuospatial deficit: Secondary | ICD-10-CM

## 2016-10-24 DIAGNOSIS — R278 Other lack of coordination: Secondary | ICD-10-CM

## 2016-10-24 DIAGNOSIS — I10 Essential (primary) hypertension: Secondary | ICD-10-CM

## 2016-10-24 DIAGNOSIS — E1159 Type 2 diabetes mellitus with other circulatory complications: Secondary | ICD-10-CM | POA: Diagnosis not present

## 2016-10-24 DIAGNOSIS — R208 Other disturbances of skin sensation: Secondary | ICD-10-CM

## 2016-10-24 DIAGNOSIS — I63513 Cerebral infarction due to unspecified occlusion or stenosis of bilateral middle cerebral arteries: Secondary | ICD-10-CM

## 2016-10-24 DIAGNOSIS — Z794 Long term (current) use of insulin: Secondary | ICD-10-CM

## 2016-10-24 DIAGNOSIS — I69315 Cognitive social or emotional deficit following cerebral infarction: Secondary | ICD-10-CM

## 2016-10-24 DIAGNOSIS — E782 Mixed hyperlipidemia: Secondary | ICD-10-CM

## 2016-10-24 DIAGNOSIS — D352 Benign neoplasm of pituitary gland: Secondary | ICD-10-CM | POA: Insufficient documentation

## 2016-10-24 DIAGNOSIS — I69354 Hemiplegia and hemiparesis following cerebral infarction affecting left non-dominant side: Secondary | ICD-10-CM

## 2016-10-24 MED ORDER — ROSUVASTATIN CALCIUM 40 MG PO TABS: 20.0000 mg | ORAL_TABLET | Freq: Every day | ORAL | Status: DC

## 2016-10-24 NOTE — Therapy (Signed)
[place] [address] [place], [place], [address] Phone: [phone]   Fax:  [phone]  Occupational Therapy Treatment  Patient Details  Name: Claudia Jenkins MRN: [id number] Date of Birth: Sep 08, 1945 Referring Provider: Dr. Ardeth Perfect  Encounter Date: 10/24/2016      OT End of Session - 10/24/16 1622    Visit Number 5   Number of Visits 17   Date for OT Re-Evaluation 11/23/16   Authorization Type [place] Time Period 09/27/16-11/25/16   Authorization - Visit Number 5   Authorization - Number of Visits 10   OT Start Time 1450   OT Stop Time 1532   OT Time Calculation (min) 42 min   Activity Tolerance Patient tolerated treatment well   Behavior During Therapy [facility] for tasks assessed/performed      Past Medical History:  Diagnosis Date  . Diabetes mellitus without complication ([place])   . High cholesterol   . Hypertension   . Stroke [facility]) 05/2016    Past Surgical History:  Procedure Laterality Date  . BACK SURGERY    . NASAL SINUS SURGERY N/A 08/29/2016   Procedure: NASAL ENDOSCOPY WITH BIOPSY OF SPHENOID AND CLIVIUS MASS;  Surgeon: Izora Gala, MD;  Location: [place];  Service: ENT;  Laterality: N/A;    There were no vitals filed for this visit.      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 PMH significant for DM II, HTN, hyperlipidemia, back surgery.    Patient Stated Goals decreased sensitivity in LUE   Currently in Pain? No/denies   Pain Score --  numbness / not pain           treatment: Attempted yellow theraband ex for shoulder abduction, however pt demonstrates significant compensation and this was discontinued. Wall slides and wall pushups 10-20 reps each, min v.c. Seated closed chain  shoulder flexion with PVC pipe frame , min-mod facilitation for positioning and to avoid compensation. Prone scapular stability exercises with arms in extension, followed by weightbearing through elbows in prone lifting chest for scapular retraction protraction. Tossing medium ball with bilateral UE's then ambulating while tossing small ball in left hand only, min difficulty/ v.c Seated hemiglide for unilateral shoulder flexion, mod v.c for positioning Arm bike x 5 mins level 3 for reciprocal movement/desensitization.                    OT Education - 10/24/16 1621    Education provided Yes   Education Details wall slides and wall pushups   Person(s) Educated Patient   Methods Explanation;Demonstration;Verbal cues   Comprehension Verbalized understanding;Returned demonstration;Verbal cues required          OT Short Term Goals - 10/24/16 1456      OT SHORT TERM GOAL #1   Title I with HEP for coordination and LUE strength- due 10/27/16   Time 4   Period Weeks   Status New     OT SHORT TERM GOAL #2   Title I with desensitization techniques   Time 4   Period Weeks   Status Achieved     OT SHORT TERM GOAL #3   Title Pt will demonstrate ability to perform basic environmental scanning with 90% or better accuracy in order to  safetly navigate a busy environment..   Time 4   Period Weeks   Status On-going           OT Long Term Goals - 10/24/16 1506      OT LONG TERM GOAL #1   Title (P)  Pt will perform mod complex home management/ cooking modified independently demonstrating good safety awareness. due 11/25/16   Time (P)  8   Period (P)  Weeks   Status (P)  New     OT LONG TERM GOAL #2   Title (P)  Pt will report ability to use LUE as a non dominant assist at least 75% of the time with pain less than or equal to 4/10.   Time (P)  8   Period (P)  Weeks   Status (P)  New     OT LONG TERM GOAL #3   Title (P)  Pt will demonstrate improved fine motor  coordination as evidenced by decreasing 9 hole peg test score to 34 secs or less with LUE   Baseline (P)  37.59   Time (P)  8   Period (P)  Weeks   Status (P)  Achieved  29.50 secs     OT LONG TERM GOAL #4   Title (P)  Pt will demonstrate ability to perform a physical and cognitive task simultaneously with 90% or better accuracy.   Time (P)  8   Period (P)  Weeks   Status (P)  New     OT LONG TERM GOAL #5   Title (P)  Pt will perfom complex visual scanning with 90% or better accuracy in prep for driving.   Time (P)  8   Period (P)  Weeks   Status (P)  New               Plan - 10/24/16 1623    Clinical Impression Statement Pt is progressing towards goals. She remains limited by decreased body awareness and LUE hypersensitivity.   Rehab Potential Fair   OT Frequency 2x / week   OT Duration 8 weeks   OT Treatment/Interventions Self-care/ADL training;Moist Heat;Fluidtherapy;DME and/or AE instruction;Patient/family education;Therapeutic exercises;Ultrasound;Therapeutic exercise;Therapeutic activities;Passive range of motion;Neuromuscular education;Cryotherapy;Parrafin;Energy conservation;Electrical Stimulation;Manual Therapy   Plan environmental scanning, LUE functional use   OT Home Exercise Plan issued putty, coordination, ball exercises seated   Consulted and Agree with Plan of Care Patient      Patient will benefit from skilled therapeutic intervention in order to improve the following deficits and impairments:  Pain, Impaired sensation, Decreased coordination, Decreased activity tolerance, Decreased endurance, Decreased strength, Impaired UE functional use, Impaired perceived functional ability, Decreased safety awareness, Decreased knowledge of precautions  Visit Diagnosis: Other lack of coordination  Muscle weakness (generalized)  Other disturbances of skin sensation  Visuospatial deficit  Hemiplegia and hemiparesis following cerebral infarction affecting left  non-dominant side (HCC)  Cognitive social or emotional deficit following cerebral infarction    Problem List Patient Active Problem List   Diagnosis Date Noted  . Essential hypertension 07/17/2016  . Diabetes mellitus ([place]) 07/17/2016  . Mixed hyperlipidemia 07/17/2016  . 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 CVA (cerebral vascular accident) ([place]) 07/16/2016    Jaekwon Mcclune 10/24/2016, 4:35 PM  [place] [address] [place], [place], [address] Phone: [phone]   Fax:  [phone]  Name: Claudia Jenkins MRN: [id number] Date of Birth: 1945/07/11

## 2016-10-24 NOTE — Patient Instructions (Signed)
Perform towel slides with both hands 10 reps each 1-2x day Perform wall pushups 10 reps 1-2 x day

## 2016-10-24 NOTE — Patient Instructions (Addendum)
-   continue plavix for stroke prevention - discontinue ASA  - decrease crestor from 40mg  to 20mg  due to side effect - check BP and glucose at home and record - BP goal not too low due to intracranial vessel occlusion, BP gaol is 130-150/70-90.  - follow up with Dr. Kathyrn Sheriff to repeat MRI for pituitary adenoma management  - Follow up with your primary care physician for stroke risk factor modification. Recommend maintain blood pressure goal 130-150/70-90, diabetes with hemoglobin A1c goal below 7.0% and lipids with LDL cholesterol goal below 70 mg/dL.  - diabetic diet and regular exercise  - follow up in 6 months

## 2016-10-24 NOTE — Progress Notes (Signed)
NEUROLOGY CLINIC NEW PATIENT NOTE  NAME: Claudia Jenkins DOB: 08/01/1945 REFERRING PHYSICIAN: Velna Hatchet, MD  I saw Claudia Jenkins as a new consult in the neurovascular clinic today regarding  Chief Complaint  Patient presents with  . Follow-up    Stroke follow up  .  HPI: Claudia Jenkins is a 71 y.o. female with PMH of HTN, DM, HLD who presents as a new patient for a stroke.   Pt was on vacation in [place], [place] when she presented to [facility] on 06/21/16 for confusion for several days and left arm weakness for 3 days. As per husband, patient did not recognize her own car, take 20 minutes to complete things that normally will take her 30 seconds to complete. Patient seemed to have changes in vision because she was bumping into things at home. CT and MRI brain showed right MCA, MCA/PCA, and MCA/ACA watershed infarction. MRA head and neck showed multiple COW vessels involvement including occluded right PCA and right M1, and high grade stenosis of left M1 and A1. There is also occluded left VA. TTE EF > 65%. EKG shows sinus rhythm. LDL 160 and A1C 11.6. Local neurology was consulted and recommended DAPT. However, pt was discharge with ASA only along with crestor.   Back to [place], followed with PCP, put back on ASA 81mg  and plavix. Continued crestor. Referred here for further neurology follow up. Pt denies smoking or alcohol use. She was not aware that she has DM, HLD. She had HTN in the past but off BP meds due to BP in control. However, her BP was high during hospitatlization. Now on losartan, dose increased from 50mg  to 100mg .   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 ENT referral has been requested.  Cardiology referral also made due to HTN and left ventricular  hypertrophy showing on TTE.     Interval history: During the interval time, pt has been doing well. No stroke like symptoms. She followed with PCP and got DM under control. Her recent A1C was 6.6. Glucose at home from 70-120s. BP today 161/88. She followed with ENT and had biopsy confirmed pituitary adenoma, followed with endocrinology and NSG, will do MRI again for evaluation. She follows with Dr. Kathyrn Sheriff at [facility]. She also followed with Dr. Einar Gip in cardiology and was told everything is fine except BP not in good control. She complain of fatigue and muscle cramps from crestor 40.    Past Medical History:  Diagnosis Date  . Diabetes mellitus without complication (Claudia Jenkins)   . High cholesterol   . Hypertension   . Stroke [facility]) 05/2016   Past Surgical History:  Procedure Laterality Date  . BACK SURGERY    . NASAL SINUS SURGERY N/A 08/29/2016   Procedure: NASAL ENDOSCOPY WITH BIOPSY OF SPHENOID AND CLIVIUS MASS;  Surgeon: Izora Gala, MD;  Location: [facility] OR;  Service: ENT;  Laterality: N/A;   Family History  Problem Relation Age of Onset  . Stroke Sister    Current Outpatient Prescriptions  Medication Sig Dispense Refill  . amLODipine (NORVASC) 10 MG tablet Take 10 mg by mouth daily.    . clopidogrel (PLAVIX) 75 MG tablet   1  . insulin glargine (LANTUS) 100 UNIT/ML injection Inject 35 Units into the skin at bedtime.     Claudia Jenkins  linaclotide (LINZESS) 72 MCG capsule Take 72 mcg by mouth daily as needed (constipation).    . metoprolol succinate (TOPROL-XL) 100 MG 24 hr tablet Take 100 mg by mouth every evening. Take with or immediately following a meal.    . rosuvastatin (CRESTOR) 40 MG tablet Take 0.5 tablets (20 mg total) by mouth at bedtime.    Claudia Jenkins XIGDUO XR 02-999 MG TB24 daily.   0   No current facility-administered medications for this visit.    Allergies  Allergen Reactions  . Latex Dermatitis and Other (See Comments)    Very uncomfortable feeling when using latex gloves  ,irritation  . Penicillins Rash    Has patient had a PCN reaction causing immediate rash, facial/tongue/throat swelling, SOB or lightheadedness with hypotension: #  #  #  YES  #  #  #  Has patient had a PCN reaction causing severe rash involving mucus membranes or skin necrosis: No Has patient had a PCN reaction that required hospitalization No Has patient had a PCN reaction occurring within the last 10 years: No If all of the above answers are "NO", then may proceed with Cephalosporin use.     Social History   Social History  . Marital status: Married    Spouse name: N/A  . Number of children: N/A  . Years of education: N/A   Occupational History  . Not on file.   Social History Main Topics  . Smoking status: Never Smoker  . Smokeless tobacco: Never Used  . Alcohol use No  . Drug use: No  . Sexual activity: Not on file   Other Topics Concern  . Not on file   Social History Narrative  . No narrative on file    Review of Systems Full 14 system review of systems performed and notable only for those listed, all others are neg:  Constitutional:  fatigue Cardiovascular:  Ear/Nose/Throat:   Skin:  Eyes:   Respiratory:   Gastroitestinal:  Genitourinary:  Hematology/Lymphatic:  Bruise easily Endocrine:  Musculoskeletal:   Allergy/Immunology:   Neurological:   Psychiatric:  Sleep:    Physical Exam  Vitals:   10/24/16 1557  BP: (!) 161/88  Pulse: 77    General - Well nourished, well developed, in no apparent distress.  Ophthalmologic - Sharp disc margins OU.  Cardiovascular - Regular rate and rhythm with no murmur.    Neck - supple, no nuchal rigidity .  Mental Status -  Level of arousal and orientation to time, place, and person were intact. Language including expression, naming, repetition, comprehension, reading, and writing was assessed and found intact. Attention span and concentration were normal. Fund of Knowledge was assessed and was  intact.  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 III, IV, VI - Extraocular movements intact. V - Facial sensation intact bilaterally. VII - Facial movement intact bilaterally. VIII - Hearing & vestibular intact bilaterally. X - Palate elevates symmetrically. XI - Chin turning & shoulder shrug intact bilaterally. XII - Tongue protrusion intact.  Motor Strength - The patient's strength was normal in all extremities and pronator drift was absent.  Bulk was normal and fasciculations were absent.   Motor Tone - Muscle tone was assessed at the neck and appendages and was normal.  Reflexes - The patient's reflexes were normal in all extremities and she had no pathological reflexes.  Sensory - Light touch, temperature/pinprick were assessed and were decreased on the left arm.    Coordination -  The patient had normal movements in the hands and feet with no ataxia or dysmetria.  Tremor was absent.  Gait and Station - The patient's transfers, posture, gait, station, and turns were observed as normal.   Imaging  I have personally reviewed the radiological images below and agree with the radiology interpretations.  MRI brain without contrast 06/25/2016 Expected evolution of right cerebral hemisphere watershed vascular territory infarcts. No new restriction diffusion. Associated FLAIR signal. Stable associated local mass effect. No progressive midline shift. No evidence for hemorrhagic transformation. White matter signal abnormality likely reflective of chronic microvascular angiopathic change. Age appropriate brain parenchymal volume loss.  CT head without contrast 06/24/2016 Acute lacunar infarct within the left thalamus. Unchanged nonhemorrhagic subacute infarct in the right frontal and parieto-occipital lobes. Sphenoid sinus mass as described on the previous sinus MRI  CT head without contrast 06/23/2016 Stable areas of infarction in the right cerebral hemisphere  likely subacute in the right frontal and parietoccipital lobes. no hemorrhage or mass effect. Stable lesion in the sphenoids in sinus.  MRA neck without contrast 06/22/2016 Left vertebral artery is occluded at its origin. No other hemodynamically significant stenosis of major arteries of the neck  MRA of the head without contrast 06/22/2016 Occluded left vertebral artery. Occluded right PCA. Occluded right M1 segment with reconstitution of distal branches. High-grade stenosis of the left A1 and M1 segments  MRI sinus/brain with and without contrast 06/22/2016 1. Approximately 2.82.52 cm polyp mass, predominantly located within the posterior aspect of the sphenoid sinus. There are large areas of bony destruction present involving the floor of the sella, and a mass is contiguous with portions of the pituitary gland. This likely reflect of mama normal with no pituitary adenoma with growth into the sphenoid sinus. There is also destruction involving the floor of the sphenoid sinuses. There is concern for invasion of the right cavernous sinus. Neurosurgical/ENT evaluation is recommended. 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 The optic chiasm and hypothalamus extend inferiorly into the superior aspect of the expanded sella. 2. multiple areas of enhancing early subacute infarction involving the watershed territory of the right cerebral hemisphere, as discussed above. Likely associated with minimal petechial hemorrhagic transformation associate with the infarct involving the lateral right occipital lobe. No associated significant mass effect or mass producing hematoma. Close clinical and/or imaging follow-up recommended. 3. normal unilateral increased T1 signal involving the right basal ganglia. This finding has been described in the setting of nonketotic hyperglycemia. Correlate clinically.  CT head without contrast 06/21/2016 1. Wedge-shaped hypodensity involving the right parietal occipital region  concerning for evolving subacute infarct within the right MCA/PCA watershed territory. There is no hemorrhagic transformation. 2. age appropriate involutional changes of the brain with mild chronic microvascular ischemic change. 3. nonspecific soft tissue opacification within the sphenoid sinuses as described above. There are bony destructive changes which involving the floor of the sella as well as the floor of the sphenoid sinuses. The bony destructive changes raises suspicion for an underlying aggressive processes, such as sphenoid sinus mass lesion or invasive sinus disease. Recommend ENT consultation with direct visualization. Could also consider further evaluation with MRI of the sinus with and without contrast. Will will of the following  TTE  Moderate to severe concentric left ventricular hypertrophy is observed. There is a normal left ventricular systolic function. The estimated ejection fraction is greater than 65%. Abnormal left ventricular dystonic feeling is observed, consistent with impaired relaxation. There is a minimal aortic stenosis present. The peak instantaneous gradient of  the aortic valve is 16 mmHg. There is a aortic annular calcification. Moderate aortic leaflet calcification is visualized. There is mitral annular calcification. There is a trace of mitral regurgitation. There is a trace tricuspid Regurgitation. The Right Ventricular Systolic Pressure Is Calculated at 17 MmHg.  Lab Review 06/21/2016 WBC 12.15 platelet 364 hemoglobin 13.9  total cholesterol 250, TG 156, HDL 59, LDL 160 Glucose 199, creatinine 0.59, ALT 31, AST 23, hemoglobin A1c 11.6   Assessment:   In summary, Claudia Jenkins is a 71 y.o. female with PMH of HTN, DM, HLD admitted in [place], [place] on 06/21/16 for right MCA, MCA/PCA, and MCA/ACA watershed infarction. MRA head and neck showed multiple COW vessels involvement including occluded right PCA and right M1, and high grade stenosis of left M1 and A1. There is  also occluded left VA. TTE EF > 65%. EKG shows sinus rhythm. LDL 160 and A1C 11.6. She is on ASA 81mg  and plavix and crestor. Her stroke most likely due to large vessel occlusion/stenosis due to uncontrolled HTN, DM and HLD. Will further pursue CTA head and neck as her MRA head and neck images not good enough for detailed evaluation. 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 Follow up with ENT, had biopsy confirmed pituitary adenoma. She is following with Dr. Kathyrn Sheriff to repeat MRI and potential surgery. DM under control recent A1C 6.6. BP better controlled. Complains of fatigue and muscle cramp due to crestor.  Finished 3 months of DAPT, will put on plavix.  Plan: - continue plavix for stroke prevention - discontinue ASA  - decrease crestor from 40mg  to 20mg  due to side effect - check BP and glucose at home and record - BP goal not too low due to intracranial vessel occlusion, BP gaol is 130-150/70-90.  - follow up with Dr. Kathyrn Sheriff to repeat MRI for pituitary adenoma management  - Follow up with your primary care physician for stroke risk factor modification. Recommend maintain blood pressure goal 130-150/70-90, diabetes with hemoglobin A1c goal below 7.0% and lipids with LDL cholesterol goal below 70 mg/dL.  - diabetic diet and regular exercise  - follow up in 6 months  I spent more than 25 minutes of face to face time with the patient. Greater than 50% of time was spent in counseling and coordination of care. We discussed further BP, LDL and DM control, follow up with NSG, and change crestor dose as well as d/c ASA.  No orders of the defined types were placed in this encounter.   Meds ordered this encounter  Medications  . XIGDUO XR 02-999 MG TB24    Sig: daily.     Refill:  0   . DISCONTD: LANTUS SOLOSTAR 100 UNIT/ML Solostar Pen    Refill:  0  . DISCONTD: Empagliflozin-Metformin HCl 12.5-500 MG TABS    Sig: Take by mouth.  . DISCONTD: metoprolol succinate (TOPROL-XL) 100 MG 24 hr tablet  . DISCONTD: linaclotide (LINZESS) 72 MCG capsule    Sig: Take by mouth.  . clopidogrel (PLAVIX) 75 MG tablet    Refill:  1  . rosuvastatin (CRESTOR) 40 MG tablet    Sig: Take 0.5 tablets (20 mg total) by mouth at bedtime.    Patient Instructions  - continue plavix for stroke prevention - discontinue ASA  - decrease crestor  from 40mg  to 20mg  due to side effect - check BP and glucose at home and record - BP goal not too low due to intracranial vessel occlusion, BP gaol is 130-150/70-90.  - follow up with Dr. Kathyrn Sheriff to repeat MRI for pituitary adenoma management  - Follow up with your primary care physician for stroke risk factor modification. Recommend maintain blood pressure goal 130-150/70-90, diabetes with hemoglobin A1c goal below 7.0% and lipids with LDL cholesterol goal below 70 mg/dL.  - diabetic diet and regular exercise  - follow up in 6 months   Rosalin Hawking, MD PhD [facility] [address] [place], [place] [address] [phone]

## 2016-10-26 ENCOUNTER — Ambulatory Visit: Payer: Medicare Other | Attending: Internal Medicine | Admitting: Occupational Therapy

## 2016-10-26 DIAGNOSIS — M6281 Muscle weakness (generalized): Secondary | ICD-10-CM | POA: Insufficient documentation

## 2016-10-26 DIAGNOSIS — I69354 Hemiplegia and hemiparesis following cerebral infarction affecting left non-dominant side: Secondary | ICD-10-CM | POA: Diagnosis present

## 2016-10-26 DIAGNOSIS — R41842 Visuospatial deficit: Secondary | ICD-10-CM | POA: Diagnosis present

## 2016-10-26 DIAGNOSIS — R278 Other lack of coordination: Secondary | ICD-10-CM | POA: Diagnosis present

## 2016-10-26 DIAGNOSIS — R208 Other disturbances of skin sensation: Secondary | ICD-10-CM | POA: Diagnosis present

## 2016-10-26 DIAGNOSIS — I69315 Cognitive social or emotional deficit following cerebral infarction: Secondary | ICD-10-CM | POA: Diagnosis present

## 2016-10-26 NOTE — Addendum Note (Signed)
Addendum  created 10/26/16 1323 by Legrand Lasser, MD   Sign clinical note    

## 2016-10-26 NOTE — Therapy (Signed)
[facility] [address] [place], [place], [address] Phone: [phone]   Fax:  [phone]  Occupational Therapy Treatment  Patient Details  Name: Claudia Jenkins MRN: [id number] Date of Birth: 1945/08/28 Referring Provider: Dr. Ardeth Perfect  Encounter Date: 10/26/2016    Past Medical History:  Diagnosis Date  . Diabetes mellitus without complication ([place])   . High cholesterol   . Hypertension   . Stroke [facility]) 05/2016    Past Surgical History:  Procedure Laterality Date  . BACK SURGERY    . NASAL SINUS SURGERY N/A 08/29/2016   Procedure: NASAL ENDOSCOPY WITH BIOPSY OF SPHENOID AND CLIVIUS MASS;  Surgeon: Izora Gala, MD;  Location: [place];  Service: ENT;  Laterality: N/A;    There were no vitals filed for this visit.      Subjective Assessment - 10/26/16 1232    Subjective  Pt reports she is more aware of visual changes   Pertinent History  s/p  right MCA/PCA watershed CVA in January 2018 who was also diagnosed with a sphenoid mass in her sinuses. PMH significant for DM II, HTN, hyperlipidemia, back surgery.    Patient Stated Goals decreased sensitivity in LUE   Currently in Pain? No/denies          Treatment: Simple cooking task to scramble an egg. Pt retrieved items and cooked modified independently demonstrating good safety awareness. Pt reports that she has not been coking at home due to not feeling like it. Environmental scanning to locate cards in sequential order, 3/11 cards missed on first pass, min v.c to locate remaining cards. Arm bike x 5 mins level 1 for conditioning.                     OT Short Term Goals - 10/26/16 1152      OT SHORT TERM GOAL #1   Title I with HEP for coordination and LUE strength- due 10/27/16   Time 4   Period Weeks   Status Achieved     OT SHORT TERM GOAL #2   Title I with desensitization techniques   Time 4   Period Weeks   Status Achieved     OT  SHORT TERM GOAL #3   Title Pt will demonstrate ability to perform basic environmental scanning with 90% or better accuracy in order to safetly navigate a busy environment..   Time 4   Period Weeks   Status On-going  82%           OT Long Term Goals - 10/26/16 1152      OT LONG TERM GOAL #1   Title Pt will perform mod complex home management/ cooking modified independently demonstrating good safety awareness. due 11/25/16   Time 8   Period Weeks   Status Achieved     OT LONG TERM GOAL #2   Title Pt will report ability to use LUE as a non dominant assist at least 75% of the time with pain less than or equal to 4/10.   Time 8   Period Weeks   Status On-going     OT LONG TERM GOAL #3   Title Pt will demonstrate improved fine motor coordination as evidenced by decreasing 9 hole peg test score to 34 secs or less with LUE   Time 8   Period Weeks   Status On-going     OT LONG TERM GOAL #4   Title Pt will demonstrate ability to perform a physical  and cognitive task simultaneously with 90% or better accuracy.   Time 8   Period Weeks   Status On-going     OT LONG TERM GOAL #5   Title Pt will perfom complex visual scanning with 90% or better accuracy in prep for driving.   Time 8   Period Weeks               Plan - 10/26/16 1308    Clinical Impression Statement Pt is progressing towards goals. She demonstrates good safety awareness with cooking task.   Rehab Potential Fair   OT Frequency 2x / week   OT Duration 8 weeks   OT Treatment/Interventions Self-care/ADL training;Moist Heat;Fluidtherapy;DME and/or AE instruction;Patient/family education;Therapeutic exercises;Ultrasound;Therapeutic exercise;Therapeutic activities;Passive range of motion;Neuromuscular education;Cryotherapy;Parrafin;Energy conservation;Electrical Stimulation;Manual Therapy   Plan LUE functional use, environmental scanning   OT Home Exercise Plan issued putty, coordination, ball exercises seated    Consulted and Agree with Plan of Care Patient      Patient will benefit from skilled therapeutic intervention in order to improve the following deficits and impairments:  Pain, Impaired sensation, Decreased coordination, Decreased activity tolerance, Decreased endurance, Decreased strength, Impaired UE functional use, Impaired perceived functional ability, Decreased safety awareness, Decreased knowledge of precautions  Visit Diagnosis: Other lack of coordination  Muscle weakness (generalized)  Other disturbances of skin sensation  Visuospatial deficit  Hemiplegia and hemiparesis following cerebral infarction affecting left non-dominant side (HCC)  Cognitive social or emotional deficit following cerebral infarction    Problem List Patient Active Problem List   Diagnosis Date Noted  . Pituitary adenoma ([place]) 10/24/2016  . Essential hypertension 07/17/2016  . Diabetes mellitus ([place]) 07/17/2016  . Mixed hyperlipidemia 07/17/2016  . 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 CVA (cerebral vascular accident) ([place]) 07/16/2016    Nelwyn Hebdon 10/26/2016, 1:11 PM Theone Murdoch, OTR/L [phone] Phone: [phone] 1:12 PM 10/26/16 [facility] [address] [place], [place], [address] Phone: [phone]   Fax:  [phone]  Name: GIADA SCHOPPE MRN: [id number] Date of Birth: July 03, 1945

## 2016-10-30 ENCOUNTER — Ambulatory Visit: Payer: Medicare Other | Admitting: Occupational Therapy

## 2016-11-01 ENCOUNTER — Ambulatory Visit: Payer: Medicare Other | Admitting: Occupational Therapy

## 2016-11-01 DIAGNOSIS — R208 Other disturbances of skin sensation: Secondary | ICD-10-CM

## 2016-11-01 DIAGNOSIS — R278 Other lack of coordination: Secondary | ICD-10-CM

## 2016-11-01 DIAGNOSIS — R41842 Visuospatial deficit: Secondary | ICD-10-CM

## 2016-11-01 DIAGNOSIS — M6281 Muscle weakness (generalized): Secondary | ICD-10-CM

## 2016-11-01 NOTE — Therapy (Signed)
[facility] [address] [place], [place], [address] Phone: [phone]   Fax:  [phone]  Occupational Therapy Treatment  Patient Details  Name: Claudia Jenkins MRN: [id number] Date of Birth: 11-08-1945 Referring Provider: Dr. Ardeth Perfect  Encounter Date: 11/01/2016      OT End of Session - 11/01/16 1246    Visit Number 6   Number of Visits 17   Date for OT Re-Evaluation 11/23/16   Authorization Type UHC Medicare/ [facility]   Authorization Time Period 09/27/16-11/23/16   Authorization - Visit Number 6   Authorization - Number of Visits 10   OT Start Time 1105   OT Stop Time 1145   OT Time Calculation (min) 40 min   Activity Tolerance Patient tolerated treatment well   Behavior During Therapy [facility] for tasks assessed/performed      Past Medical History:  Diagnosis Date  . Diabetes mellitus without complication ([place])   . High cholesterol   . Hypertension   . Stroke [facility]) 05/2016    Past Surgical History:  Procedure Laterality Date  . BACK SURGERY    . NASAL SINUS SURGERY N/A 08/29/2016   Procedure: NASAL ENDOSCOPY WITH BIOPSY OF SPHENOID AND CLIVIUS MASS;  Surgeon: Izora Gala, MD;  Location: [facility];  Service: ENT;  Laterality: N/A;    There were no vitals filed for this visit.              Wall slides and wall pushups 10-20 reps each, min v.c. Seated closed chain shoulder flexion, chest press, and diagonals with medium ball, min facilitation for positioning and to avoid compensation. Prone scapular stability exercises with arms in extension, followed by weightbearing through elbows in prone lifting chest for scapular retraction protraction.  ambulating while tossing small ball in between hands only and performing category generation, min difficulty/ v.c Tabletop scanning with a cognitive component, 100% accuracy(large print cross out the repeating number) Placing graded clothespins on vertical antennae  with LUE for increased functional use,and sustained pinch               OT Short Term Goals - 10/26/16 1152      OT SHORT TERM GOAL #1   Title I with HEP for coordination and LUE strength- due 10/27/16   Time 4   Period Weeks   Status Achieved     OT SHORT TERM GOAL #2   Title I with desensitization techniques   Time 4   Period Weeks   Status Achieved     OT SHORT TERM GOAL #3   Title Pt will demonstrate ability to perform basic environmental scanning with 90% or better accuracy in order to safetly navigate a busy environment..   Time 4   Period Weeks   Status On-going  82%           OT Long Term Goals - 11/01/16 1116      OT LONG TERM GOAL #1   Title Pt will perform mod complex home management/ cooking modified independently demonstrating good safety awareness. due 11/25/16   Time 8   Status Achieved     OT LONG TERM GOAL #2   Title Pt will report ability to use LUE as a non dominant assist at least 75% of the time with pain less than or equal to 4/10.   Time 8   Period Weeks   Status Achieved     OT LONG TERM GOAL #4   Title Pt will demonstrate ability  to perform a physical and cognitive task simultaneously with 90% or better accuracy.   Time 8   Period Weeks   Status Achieved     OT LONG TERM GOAL #5   Title Pt will perfom complex visual scanning with 90% or better accuracy in prep for driving.   Time 8   Period Weeks   Status On-going               Plan - 11/01/16 1247    Clinical Impression Statement Pt is progressing towards goals. She reports she is doing better overall at home, however she reports she was not aware of her visual deficits.   Rehab Potential Fair   OT Frequency 2x / week   OT Duration 8 weeks   OT Treatment/Interventions Self-care/ADL training;Moist Heat;Fluidtherapy;DME and/or AE instruction;Patient/family education;Therapeutic exercises;Ultrasound;Therapeutic exercise;Therapeutic activities;Passive range of  motion;Neuromuscular education;Cryotherapy;Parrafin;Energy conservation;Electrical Stimulation;Manual Therapy   Plan environmental scanning , LUE strengthening   OT Home Exercise Plan issued putty, coordination, ball exercises seated   Consulted and Agree with Plan of Care Patient      Patient will benefit from skilled therapeutic intervention in order to improve the following deficits and impairments:  Pain, Impaired sensation, Decreased coordination, Decreased activity tolerance, Decreased endurance, Decreased strength, Impaired UE functional use, Impaired perceived functional ability, Decreased safety awareness, Decreased knowledge of precautions  Visit Diagnosis: Other lack of coordination  Muscle weakness (generalized)  Other disturbances of skin sensation  Visuospatial deficit    Problem List Patient Active Problem List   Diagnosis Date Noted  . Pituitary adenoma ([place]) 10/24/2016  . Essential hypertension 07/17/2016  . Diabetes mellitus ([place]) 07/17/2016  . Mixed hyperlipidemia 07/17/2016  . 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 CVA (cerebral vascular accident) ([place]) 07/16/2016    Claudia Jenkins 11/01/2016, 12:55 PM  [facility] [address] [place], [place], [address] Phone: [phone]   Fax:  [phone]  Name: Claudia Jenkins MRN: [id number] Date of Birth: August 03, 1945

## 2016-11-06 ENCOUNTER — Ambulatory Visit: Payer: Medicare Other | Admitting: Occupational Therapy

## 2016-11-06 DIAGNOSIS — I69315 Cognitive social or emotional deficit following cerebral infarction: Secondary | ICD-10-CM

## 2016-11-06 DIAGNOSIS — R278 Other lack of coordination: Secondary | ICD-10-CM | POA: Diagnosis not present

## 2016-11-06 DIAGNOSIS — R41842 Visuospatial deficit: Secondary | ICD-10-CM

## 2016-11-06 DIAGNOSIS — R208 Other disturbances of skin sensation: Secondary | ICD-10-CM

## 2016-11-06 DIAGNOSIS — M6281 Muscle weakness (generalized): Secondary | ICD-10-CM

## 2016-11-06 NOTE — Therapy (Signed)
[facility] [address] [place], [place], [address] Phone: [phone]   Fax:  [phone]  Occupational Therapy Treatment  Patient Details  Name: Claudia Jenkins MRN: [id number] Date of Birth: January 02, 1946 Referring Provider: Dr. Ardeth Perfect  Encounter Date: 11/06/2016      OT End of Session - 11/06/16 1227    Visit Number 7   Number of Visits 17   Date for OT Re-Evaluation 11/23/16   Authorization Type UHC Medicare/ [place] [facility]   Authorization Time Period 09/27/16-11/23/16   Authorization - Visit Number 7   Authorization - Number of Visits 10   OT Start Time 0277   OT Stop Time 1231   OT Time Calculation (min) 38 min   Activity Tolerance Patient tolerated treatment well   Behavior During Therapy [facility] for tasks assessed/performed      Past Medical History:  Diagnosis Date  . Diabetes mellitus without complication ([place])   . High cholesterol   . Hypertension   . Stroke [facility]) 05/2016    Past Surgical History:  Procedure Laterality Date  . BACK SURGERY    . NASAL SINUS SURGERY N/A 08/29/2016   Procedure: NASAL ENDOSCOPY WITH BIOPSY OF SPHENOID AND CLIVIUS MASS;  Surgeon: Izora Gala, MD;  Location: [facility];  Service: ENT;  Laterality: N/A;    There were no vitals filed for this visit.      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 PMH significant for DM II, HTN, hyperlipidemia, back surgery.    Patient Stated Goals decreased sensitivity in LUE   Currently in Pain? No/denies         Treatment: weightbearing through bilateral UE's over table while rocking forwards and back, wall pushups x 15 min v.c for positioning Seated AA/ROM closed chain shoulder flexion with therapist facilitating LUE shoulder position, mirror in front for visual feedback. Hemiglide for  unilateral shoulder flexion LUE, min facilitation/ v.c Unilateral mid-high range reaching to place remove itemss form cabinets with LUE, min v.c Arm bike x 6 mins level 3 for conditioning/ reciprocal movment. Environmental scanning 11/15 located first pass, 3 more located second pass, pt required v.c to locate last card.  Therapist reinforced organized effective scan pattern Gripper set at level 1 for sustained grip, min difficulty.                       OT Short Term Goals - 10/26/16 1152      OT SHORT TERM GOAL #1   Title I with HEP for coordination and LUE strength- due 10/27/16   Time 4   Period Weeks   Status Achieved     OT SHORT TERM GOAL #2   Title I with desensitization techniques   Time 4   Period Weeks   Status Achieved     OT SHORT TERM GOAL #3   Title Pt will demonstrate ability to perform basic environmental scanning with 90% or better accuracy in order to safetly navigate a busy environment..   Time 4   Period Weeks   Status On-going  82%           OT Long Term Goals - 11/01/16 1116      OT LONG TERM GOAL #1   Title Pt will perform mod complex home management/ cooking  modified independently demonstrating good safety awareness. due 11/25/16   Time 8   Status Achieved     OT LONG TERM GOAL #2   Title Pt will report ability to use LUE as a non dominant assist at least 75% of the time with pain less than or equal to 4/10.   Time 8   Period Weeks   Status Achieved     OT LONG TERM GOAL #4   Title Pt will demonstrate ability to perform a physical and cognitive task simultaneously with 90% or better accuracy.   Time 8   Period Weeks   Status Achieved     OT LONG TERM GOAL #5   Title Pt will perfom complex visual scanning with 90% or better accuracy in prep for driving.   Time 8   Period Weeks   Status On-going               Plan - 11/06/16 1306    Clinical Impression Statement Pt is progressing towards goals. However she  continues to demonstrate compensatory patterns with LUE and demonstrates continued visuospatial deficits.   Rehab Potential Fair   OT Frequency 2x / week   OT Duration 8 weeks   OT Treatment/Interventions Self-care/ADL training;Moist Heat;Fluidtherapy;DME and/or AE instruction;Patient/family education;Therapeutic exercises;Ultrasound;Therapeutic exercise;Therapeutic activities;Passive range of motion;Neuromuscular education;Cryotherapy;Parrafin;Energy conservation;Electrical Stimulation;Manual Therapy   Plan environmental scanning, LUE functional use   OT Home Exercise Plan issued putty, coordination, ball exercises seated   Consulted and Agree with Plan of Care Patient      Patient will benefit from skilled therapeutic intervention in order to improve the following deficits and impairments:  Pain, Impaired sensation, Decreased coordination, Decreased activity tolerance, Decreased endurance, Decreased strength, Impaired UE functional use, Impaired perceived functional ability, Decreased safety awareness, Decreased knowledge of precautions  Visit Diagnosis: Other lack of coordination  Muscle weakness (generalized)  Other disturbances of skin sensation  Visuospatial deficit  Cognitive social or emotional deficit following cerebral infarction    Problem List Patient Active Problem List   Diagnosis Date Noted  . Pituitary adenoma ([place]) 10/24/2016  . Essential hypertension 07/17/2016  . Diabetes mellitus ([place]) 07/17/2016  . Mixed hyperlipidemia 07/17/2016  . 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 CVA (cerebral vascular accident) ([place]) 07/16/2016    [name] 11/06/2016, 1:08 PM  [name] [address] [place], [place], [address] Phone: [phone]   Fax:  [phone]  Name: Claudia Jenkins MRN: [id number] Date of Birth: [birth year]

## 2016-11-07 ENCOUNTER — Ambulatory Visit: Payer: Medicare Other | Admitting: Occupational Therapy

## 2016-11-07 DIAGNOSIS — M6281 Muscle weakness (generalized): Secondary | ICD-10-CM

## 2016-11-07 DIAGNOSIS — R208 Other disturbances of skin sensation: Secondary | ICD-10-CM

## 2016-11-07 DIAGNOSIS — R41842 Visuospatial deficit: Secondary | ICD-10-CM

## 2016-11-07 DIAGNOSIS — R278 Other lack of coordination: Secondary | ICD-10-CM

## 2016-11-07 NOTE — Patient Instructions (Addendum)
Laying on your back hold a ball between your hands , raise arms up to just above shoulder height without elevating shoulder or arching back,then  lower ball back to your lap. Keep elbows straight.   Perform 2 sets of 10 reps 1x day  Laying on your back, hold a ball in between both hands, straighten elbows, then bend elbows and bring ball to your chest     Perform 2 sets of 10 reps 1x day  Laying on your back, hold a small water bottle in your left hand, raise and lower your arm to shoulder height 10-15 reps 1x day, make sure to keep your shoulder down and shoulder blade against bed  Try to use your left arm as much as possible for light tasks like putting away groceries or unloading dishwasher.

## 2016-11-07 NOTE — Therapy (Signed)
[facility] [address] [place], [place], [address] Phone: [phone]   Fax:  [phone]  Occupational Therapy Treatment  Patient Details  Name: Claudia Jenkins MRN: [id number] Date of Birth: 1946-01-18 Referring Provider: Dr. Ardeth Perfect  Encounter Date: 11/07/2016      OT End of Session - 11/07/16 0839    Visit Number 8   Number of Visits 17   Date for OT Re-Evaluation 11/23/16   Authorization Type UHC Medicare/ [place] [facility]   Authorization Time Period 09/27/16-11/23/16   Authorization - Visit Number 8   Authorization - Number of Visits 10   OT Start Time 0805   OT Stop Time 0845   OT Time Calculation (min) 40 min      Past Medical History:  Diagnosis Date  . Diabetes mellitus without complication ([place])   . High cholesterol   . Hypertension   . Stroke [facility]) 05/2016    Past Surgical History:  Procedure Laterality Date  . BACK SURGERY    . NASAL SINUS SURGERY N/A 08/29/2016   Procedure: NASAL ENDOSCOPY WITH BIOPSY OF SPHENOID AND CLIVIUS MASS;  Surgeon: Izora Gala, MD;  Location: [facility];  Service: ENT;  Laterality: N/A;    There were no vitals filed for this visit.      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 PMH significant for DM II, HTN, hyperlipidemia, back surgery.    Patient Stated Goals decreased sensitivity in LUE   Currently in Pain? No/denies                 Arm bike x 6 mins level 3 for conditioning Supine closed chain shoulder flexion with bilateral UE's with cane and 2 lbs weight attached min v.c Hemiglide for unilateral AA/ROM x 15 reps Functional mid-high range reaching with LUE in standing, mod v.c for positioning and weight shift though LLE, pt copied small peg design on vertical surface with min drops, no cues required for  correct design.             OT Education - 11/07/16 0902    Education provided Yes   Education Details updates to HEP- see pt instructions, pt performed 5-10 reps each   Person(s) Educated Patient   Methods Explanation;Demonstration;Verbal cues;Handout   Comprehension Verbalized understanding;Returned demonstration;Verbal cues required          OT Short Term Goals - 10/26/16 1152      OT SHORT TERM GOAL #1   Title I with HEP for coordination and LUE strength- due 10/27/16   Time 4   Period Weeks   Status Achieved     OT SHORT TERM GOAL #2   Title I with desensitization techniques   Time 4   Period Weeks   Status Achieved     OT SHORT TERM GOAL #3   Title Pt will demonstrate ability to perform basic environmental scanning with 90% or better accuracy in order to safetly navigate a busy environment..   Time 4   Period Weeks   Status On-going  82%           OT Long Term Goals - 11/07/16 0813      OT LONG TERM GOAL #1   Title Pt will perform mod complex home management/ cooking modified independently demonstrating good safety awareness.  due 11/25/16   Time 8   Period Weeks   Status Achieved     OT LONG TERM GOAL #2   Title Pt will report ability to use LUE as a non dominant assist at least 75% of the time with pain less than or equal to 4/10.   Time 8   Period Weeks   Status Achieved     OT LONG TERM GOAL #3   Title Pt will demonstrate improved fine motor coordination as evidenced by decreasing 9 hole peg test score to 34 secs or less with LUE   Time 8   Period Weeks   Status On-going     OT LONG TERM GOAL #4   Title Pt will demonstrate ability to perform a physical and cognitive task simultaneously with 90% or better accuracy.   Time 8   Period Weeks   Status On-going     OT LONG TERM GOAL #5   Title Pt will perfom complex visual scanning with 90% or better accuracy in prep for driving.   Time 8   Period Weeks   Status On-going                Plan - 11/07/16 4782    Clinical Impression Statement Pt is progressing towards goals. She continues to require v.c for LUE positioning and left side body awareness.   Rehab Potential Fair   OT Frequency 2x / week   OT Duration 8 weeks   OT Treatment/Interventions Self-care/ADL training;Moist Heat;Fluidtherapy;DME and/or AE instruction;Patient/family education;Therapeutic exercises;Ultrasound;Therapeutic exercise;Therapeutic activities;Passive range of motion;Neuromuscular education;Cryotherapy;Parrafin;Energy conservation;Electrical Stimulation;Manual Therapy   Plan check goals, anticipate d/c next visit   OT Home Exercise Plan issued putty, coordination, ball exercises seated   Consulted and Agree with Plan of Care Patient      Patient will benefit from skilled therapeutic intervention in order to improve the following deficits and impairments:  Pain, Impaired sensation, Decreased coordination, Decreased activity tolerance, Decreased endurance, Decreased strength, Impaired UE functional use, Impaired perceived functional ability, Decreased safety awareness, Decreased knowledge of precautions  Visit Diagnosis: Other lack of coordination  Muscle weakness (generalized)  Other disturbances of skin sensation  Visuospatial deficit    Problem List Patient Active Problem List   Diagnosis Date Noted  . Pituitary adenoma ([place]) 10/24/2016  . Essential hypertension 07/17/2016  . Diabetes mellitus ([place]) 07/17/2016  . Mixed hyperlipidemia 07/17/2016  . 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 CVA (cerebral vascular accident) ([place]) 07/16/2016    Claudia Jenkins 11/07/2016, 2:25 PM Theone Murdoch, OTR/L [phone] Phone: [phone] 2:29 PM 11/07/16 [facility] [address] [place], [place], [address] Phone: [phone]   Fax:  [phone]  Name: Claudia Jenkins MRN: [id number] Date of Birth: 1945-06-21

## 2016-11-20 ENCOUNTER — Ambulatory Visit: Payer: Medicare Other | Admitting: Occupational Therapy

## 2016-11-20 DIAGNOSIS — I69315 Cognitive social or emotional deficit following cerebral infarction: Secondary | ICD-10-CM

## 2016-11-20 DIAGNOSIS — R41842 Visuospatial deficit: Secondary | ICD-10-CM

## 2016-11-20 DIAGNOSIS — M6281 Muscle weakness (generalized): Secondary | ICD-10-CM

## 2016-11-20 DIAGNOSIS — R278 Other lack of coordination: Secondary | ICD-10-CM | POA: Diagnosis not present

## 2016-11-20 DIAGNOSIS — I69354 Hemiplegia and hemiparesis following cerebral infarction affecting left non-dominant side: Secondary | ICD-10-CM

## 2016-11-20 DIAGNOSIS — R208 Other disturbances of skin sensation: Secondary | ICD-10-CM

## 2016-11-20 NOTE — Patient Instructions (Signed)
Before you return to driving alone, make sure you have MD clearance Practice driving in a parking lot with a licensed driver, Then on familiar roads with a licensed driver If everything goes ok you should start out driving familiar roads first, make sure you are double checking the left side since you are missing part on your left visual field  Local Driver Evaluation Programs:  Comprehensive Evaluation: includes clinical and in vehicle behind the wheel testing by OCCUPATIONAL THERAPIST. Programs have varying levels of adaptive controls available for trial.   [employer], [place] [address] [place], [place]  [address] [phone] or [phone] http://www.driver-rehab.com Evaluator:  Richelle Ito, OT/CDRS/CDI/SCDCM/[facility] [address] [place], [place] [address] [phone] [email or web address] Evaluators:  Bertram Savin, OT and Mertie Clause, OT  [employer] ([place]) Physical [place] [address] [place], [place]  [address] [phone] U6333 [email or web address] Evaluators:  Bernadene Bell, KT; Heron Sabins, KT;  Shirlee Latch, KT (KT=kiniesotherapist)   Clinical evaluations only:  Includes clinical testing, refers to other programs or local certified driving instructor for behind the wheel testing.  [facility] (outpatient Rehab) [place] [address] [place],  [address] [phone] for scheduling [email or web address] Evaluators:  Valentino Hue, OT; Haynes Hoehn, OT  Other area clinical evaluators available upon request including [facility], [place] and [facility].       Resource List What is a Warden/ranger: Your Road Ahead - A Guide to [employer] Evaluations http://www.thehartford.com/resources/mature-market-excellence/publications-on-aging  Association for Musician - Disability and Driving Fact Sheets http://www.aded.net/?page=510  Driving after a Brain Injury: Brain Injury Association of [employer] La[place]aleEstates.be?A=SearchResult&SearchID=9495675&ObjectID=2758842&ObjectType=35  Driving with Adaptive Equipment: Chiropractor Association DebtRide.[email or web address]        Exercises: Place hands on the table and rock back onto your heels, then forward raising your chest 10-15 reps 1x day  Seated, Hold a 1 lbs water bottle and perform the following 2 sets of 10 reps- make sure you are not leaning to right side  With your left arm straight raise arm to shoulder height   Perform chest press  End and straighten elbow

## 2016-11-22 DIAGNOSIS — R278 Other lack of coordination: Secondary | ICD-10-CM | POA: Diagnosis not present

## 2016-11-22 NOTE — Therapy (Signed)
[facility] [address] [place], [place], [address] Phone: [phone]   Fax:  [phone]  Occupational Therapy Treatment  Patient Details  Name: Claudia Jenkins MRN: [id number] Date of Birth: 1945/08/16 Referring Provider: Dr. Ardeth Perfect  Encounter Date: 11/20/2016      OT End of Session - 11/22/16 1255    Visit Number 9   Number of Visits 17   Date for OT Re-Evaluation 11/23/16   Authorization Type [place] - Visit Number 9   Authorization - Number of Visits 10   OT Start Time 0805   OT Stop Time 0845   OT Time Calculation (min) 40 min      Past Medical History:  Diagnosis Date  . Diabetes mellitus without complication ([place])   . High cholesterol   . Hypertension   . Stroke [facility]) 05/2016    Past Surgical History:  Procedure Laterality Date  . BACK SURGERY    . NASAL SINUS SURGERY N/A 08/29/2016   Procedure: NASAL ENDOSCOPY WITH BIOPSY OF SPHENOID AND CLIVIUS MASS;  Surgeon: Izora Gala, MD;  Location: [facility];  Service: ENT;  Laterality: N/A;    There were no vitals filed for this visit.      Subjective Assessment - 11/22/16 1254    Subjective  Pt denies pain   Patient Stated Goals decreased sensitivity in LUE   Currently in Pain? No/denies         Treatment: Therapist checked progress towards goals and provided d/c recommendations/ updated HEP.                     OT Education - 11/22/16 1254    Education provided Yes   Education Details driving recommendations after cleared by MD, driving eval info, updated HEP see pt instructions   Person(s) Educated Patient   Methods Explanation;Demonstration;Verbal cues   Comprehension Verbalized understanding;Returned demonstration;Verbal cues required          OT Short Term Goals - 11/22/16 1256      OT SHORT TERM GOAL #1   Title I with HEP for coordination and LUE strength- due 10/27/16   Time 4   Period Weeks   Status Achieved     OT SHORT TERM GOAL #2   Title I with desensitization techniques   Time 4   Period Weeks   Status Achieved     OT SHORT TERM GOAL #3   Title Pt will demonstrate ability to perform basic environmental scanning with 90% or better accuracy in order to safetly navigate a busy environment..   Time 4   Period Weeks   Status On-going  90%           OT Long Term Goals - 11/20/16 0827      OT LONG TERM GOAL #1   Title Pt will perform mod complex home management/ cooking modified independently demonstrating good safety awareness. due 11/25/16   Time 8   Period Weeks   Status Achieved     OT LONG TERM GOAL #2   Title Pt will report ability to use LUE as a non dominant assist at least 75% of the time with pain less than or equal to 4/10.   Time 8   Period Weeks   Status Achieved     OT LONG TERM GOAL #3   Title Pt will demonstrate improved fine motor coordination as evidenced by decreasing 9 hole peg test score to 34  secs or less with LUE   Time 8   Period Weeks   Status Achieved  31.82 secs     OT LONG TERM GOAL #4   Title Pt will demonstrate ability to perform a physical and cognitive task simultaneously with 90% or better accuracy.   Time 8   Period Weeks   Status Achieved  met 90%     OT LONG TERM GOAL #5   Title Pt will perfom complex visual scanning with 90% or better accuracy in prep for driving.   Time 8   Period Weeks   Status Achieved  90%               Plan - December 21, 2016 1257    Clinical Impression Statement Pt requests d/c form therapy. Pt may benefit from driving eval prior to return to driving due to cognitive and visuospatial deficits.   Rehab Potential Fair   OT Frequency 2x / week   OT Duration 8 weeks   OT Treatment/Interventions Self-care/ADL training;Moist Heat;Fluidtherapy;DME and/or AE instruction;Patient/family education;Therapeutic exercises;Ultrasound;Therapeutic exercise;Therapeutic activities;Passive  range of motion;Neuromuscular education;Cryotherapy;Parrafin;Energy conservation;Electrical Stimulation;Manual Therapy   Plan d/c OT   OT Home Exercise Plan issued putty, coordination, ball exercises seated   Consulted and Agree with Plan of Care Patient      Patient will benefit from skilled therapeutic intervention in order to improve the following deficits and impairments:  Pain, Impaired sensation, Decreased coordination, Decreased activity tolerance, Decreased endurance, Decreased strength, Impaired UE functional use, Impaired perceived functional ability, Decreased safety awareness, Decreased knowledge of precautions  Visit Diagnosis: Other lack of coordination  Muscle weakness (generalized)  Other disturbances of skin sensation  Visuospatial deficit  Cognitive social or emotional deficit following cerebral infarction  Hemiplegia and hemiparesis following cerebral infarction affecting left non-dominant side (HCC)      G-Codes - 12/21/16 1259    Functional Assessment Tool Used (Outpatient only) 9 hole peg test LUE 31.82 secs   Functional Limitation Carrying, moving and handling objects   Carrying, Moving and Handling Objects Goal Status ([id number]) At least 1 percent but less than 20 percent impaired, limited or restricted   Carrying, Moving and Handling Objects Discharge Status [phone]) At least 1 percent but less than 20 percent impaired, limited or restricted    OCCUPATIONAL THERAPY DISCHARGE SUMMARY   Current functional level related to goals / functional outcomes: Pt made good overall progress, see above.   Remaining deficits: Cognitive, visuospatial and decreased LUE strength   Education / Equipment: Pt was educated regarding safety once cleared for driving by MD, graduated driving schedule and driving eval as well as HEP. Pt agrees with d/c.  Plan: Patient agrees to discharge.  Patient goals were not met. Patient is being discharged due to meeting the stated rehab  goals.  ?????     Problem List Patient Active Problem List   Diagnosis Date Noted  . 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 Essential hypertension 07/17/2016  . Diabetes mellitus ([facility]) 07/17/2016  . Mixed hyperlipidemia 07/17/2016  . 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 CVA (cerebral vascular accident) ([facility]) 07/16/2016    Dierdre Mccalip 2016/12/21, 1:01 PM Theone Murdoch, OTR/L [phone] Phone: [phone] 1:01 PM 2016-12-21 [facility] [address] [place], [place], [address] Phone: [phone]   Fax:  [phone]  Name: LAURINE KUYPER MRN: [id number] Date of Birth: [birth year]

## 2016-12-26 ENCOUNTER — Other Ambulatory Visit: Payer: Self-pay | Admitting: Radiology

## 2017-04-25 ENCOUNTER — Other Ambulatory Visit: Payer: Self-pay | Admitting: Neurosurgery

## 2017-04-25 DIAGNOSIS — D352 Benign neoplasm of pituitary gland: Secondary | ICD-10-CM

## 2017-04-25 NOTE — Progress Notes (Signed)
[facility]  PATIENT: Claudia Jenkins DOB: [birth year]   REASON FOR VISIT: Follow-up for stroke  HISTORY FROM: Patient    HISTORY OF PRESENT ILLNESS:Claudia Jenkins is a 71 y.o. female with PMH of HTN, DM, HLD who presents as a new patient for a stroke.   Pt was on vacation in [place], [place] when she presented to [facility] on 06/21/16 for confusion for several days and left arm weakness for 3 days. As per husband, patient did not recognize her own car, take 20 minutes to complete things that normally will take her 30 seconds to complete. Patient seemed to have changes in vision because she was bumping into things at home. CT and MRI brain showed right MCA, MCA/PCA, and MCA/ACA watershed infarction. MRA head and neck showed multiple COW vessels involvement including occluded right PCA and right M1, and high grade stenosis of left M1 and A1. There is also occluded left VA. TTE EF > 65%. EKG shows sinus rhythm. LDL 160 and A1C 11.6. Local neurology was consulted and recommended DAPT. However, pt was discharge with ASA only along with crestor.   Back to [facility], followed with PCP, put back on ASA 81mg  and plavix. Continued crestor. Referred here for further neurology follow up. Pt denies smoking or alcohol use. She was not aware that she has DM, HLD. She had HTN in the past but off BP meds due to BP in control. However, her BP was high during hospitatlization. Now on losartan, dose increased from 50mg  to 100mg .   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 ENT referral has been requested.  Cardiology referral also made due to HTN and left ventricular hypertrophy showing on TTE.     Interval history:10/24/16 Dr. Erlinda Jenkins During the interval time, pt has been doing  well. No stroke like symptoms. She followed with PCP and got DM under control. Her recent A1C was 6.6. Glucose at home from 70-120s. BP today 161/88. She followed with ENT and had biopsy confirmed pituitary adenoma, followed with endocrinology and NSG, will do MRI again for evaluation. She follows with [facility] Kath[facility]n Jenkins at [facility]. She also followed with Dr. Einar Jenkins in cardiology and was told everything is fine except BP not in good control. She complain of fatigue and muscle cramps from crestor 40.  UPDATE 11/30/2018CM Claudia Jenkins, 71 year old female returns for follow-up after having stroke event in January 2018.  She is currently on Plavix without recurrent stroke or TIA symptoms.  She has no bleeding and no bruising.  She reports most recent hemoglobin A1c 5.9, she continues her diabetic medications.  She is on Crestor for hyperlipidemia without myalgias.  She claims she walks every day for exercise.  She continues to follow with Claudia Jenkins at [facility].  For pituitary adenoma she is also followed by Dr. Einar Jenkins cardiology.  She returns for reevaluation.  She has no new neurologic symptoms  REVIEW OF SYSTEMS: Full 14 system review of systems performed and notable only for those listed, all others are neg:  Constitutional: neg  Cardiovascular: neg Ear/Nose/Throat: neg  Skin: neg Eyes: neg Respiratory: neg Gastroitestinal: neg  Hematology/Lymphatic: neg  Endocrine: neg Musculoskeletal:neg Allergy/Immunology: neg Neurological: neg Psychiatric: neg Sleep : neg   ALLERGIES: Allergies  Allergen Reactions  . Latex Dermatitis and Other (  See Comments)    Very uncomfortable feeling when using latex gloves ,irritation  . Penicillins Rash    Has patient had a PCN reaction causing immediate rash, facial/tongue/throat swelling, SOB or lightheadedness with hypotension: #  #  #  YES  #  #  #  Has patient had a PCN reaction causing severe rash involving mucus membranes or skin  necrosis: No Has patient had a PCN reaction that required hospitalization No Has patient had a PCN reaction occurring within the last 10 years: No If all of the above answers are "NO", then may proceed with Cephalosporin use.      HOME MEDICATIONS: Outpatient Medications Prior to Visit  Medication Sig Dispense Refill  . amLODipine (NORVASC) 10 MG tablet Take 10 mg by mouth daily.    . clopidogrel (PLAVIX) 75 MG tablet   1  . insulin glargine (LANTUS) 100 UNIT/ML injection Inject 35 Units into the skin at bedtime.     [name] JARDIANCE 10 MG TABS tablet TK 1 T PO D  1  . losartan (COZAAR) 100 MG tablet TK 1 T PO D  3  . metFORMIN (GLUCOPHAGE) 500 MG tablet TK 1 T PO BID  3  . metoprolol succinate (TOPROL-XL) 100 MG 24 hr tablet Take 100 mg by mouth every evening. Take with or immediately following a meal.    . rosuvastatin (CRESTOR) 20 MG tablet TK 1 T PO D  1  . linaclotide (LINZESS) 72 MCG capsule Take 72 mcg by mouth daily as needed (constipation).    [name] XIGDUO XR 02-999 MG TB24 daily.   0  . rosuvastatin (CRESTOR) 40 MG tablet Take 0.5 tablets (20 mg total) by mouth at bedtime.     No facility-administered medications prior to visit.     PAST MEDICAL HISTORY: Past Medical History:  Diagnosis Date  . Diabetes mellitus without complication ([facility])   . High cholesterol   . Hypertension   . Stroke [facility]) 05/2016    PAST SURGICAL HISTORY: Past Surgical History:  Procedure Laterality Date  . BACK SURGERY    . BREAST BIOPSY  2018  . NASAL SINUS SURGERY N/A 08/29/2016   Procedure: NASAL ENDOSCOPY WITH BIOPSY OF SPHENOID AND CLIVIUS MASS;  Surgeon: Izora Gala, MD;  Location: [facility];  Service: ENT;  Laterality: N/A;    FAMILY HISTORY: Family History  Problem Relation Age of Onset  . Stroke Sister     SOCIAL HISTORY: Social History   Socioeconomic History  . Marital status: Married    Spouse name: Not on file  . Number of children: Not on file  . Years of education: Not on file    . Highest education level: Not on file  Social Needs  . Financial resource strain: Not on file  . Food insecurity - worry: Not on file  . Food insecurity - inability: Not on file  . Transportation needs - medical: Not on file  . Transportation needs - non-medical: Not on file  Occupational History  . Not on file  Tobacco Use  . Smoking status: Never Smoker  . Smokeless tobacco: Never Used  Substance and Sexual Activity  . Alcohol use: No  . Drug use: No  . Sexual activity: Not on file  Other Topics Concern  . Not on file  Social History Narrative  . Not on file     PHYSICAL EXAM  Vitals:   04/26/17 0939  BP: (!) 149/80  Pulse: 73  Weight: 156 lb 3.2 oz (  70.9 kg)   Body mass index is 28.57 kg/m.  Generalized: Well developed, in no acute distress  Head: normocephalic and atraumatic,. Oropharynx benign  Neck: Supple, no carotid bruits  Cardiac: Regular rate rhythm, no murmur  Musculoskeletal: No deformity   Neurological examination   Mentation: Alert oriented to time, place, history taking. Attention span and concentration appropriate. Recent and remote memory intact.  Follows all commands speech and language fluent.   Cranial nerve II-XII: Fundoscopic exam reveals sharp disc margins.Pupils were equal round reactive to light extraocular movements were full, visual field quadrantanopia at LLQ Facial sensation and strength were normal. hearing was intact to finger rubbing bilaterally. Uvula tongue midline. head turning and shoulder shrug were normal and symmetric.Tongue protrusion into cheek strength was normal. Motor: normal bulk and tone, full strength in the BUE, BLE, fine finger movements normal, no pronator drift. No focal weakness Sensory: normal and symmetric to light touch,  Coordination: finger-nose-finger, heel-to-shin bilaterally, no dysmetria, no tremor Reflexes: Brachioradialis 2/2, biceps 2/2, triceps 2/2, patellar 2/2, Achilles 2/2, plantar responses were  flexor bilaterally. Gait and Station: Rising up from seated position without assistance, normal stance,  moderate stride, good arm swing, smooth turning, able to perform tiptoe, and heel walking without difficulty. Tandem gait is steady  DIAGNOSTIC DATA (LABS, IMAGING, TESTING) - I reviewed patient records, labs, notes, testing and imaging myself where available.  Lab Results  Component Value Date   WBC 12.6 (H) 08/24/2016   HGB 12.4 08/24/2016   HCT 36.3 08/24/2016   MCV 90.1 08/24/2016   PLT 389 08/24/2016      Component Value Date/Time   NA 139 08/24/2016 0944   K 4.5 08/24/2016 0944   CL 105 08/24/2016 0944   CO2 24 08/24/2016 0944   GLUCOSE 95 08/24/2016 0944   BUN 17 08/24/2016 0944   CREATININE 0.89 08/24/2016 0944   CALCIUM 9.6 08/24/2016 0944   PROT 7.0 07/14/2016 0928   ALBUMIN 3.6 07/14/2016 0928   AST 40 07/14/2016 0928   ALT 67 (H) 07/14/2016 0928   ALKPHOS 70 07/14/2016 0928   BILITOT 0.7 07/14/2016 0928   GFRNONAA >60 08/24/2016 0944   GFRAA >60 08/24/2016 0944    Lab Results  Component Value Date   HGBA1C 8.5 (H) 08/24/2016   ASSESSMENT AND PLAN 1 Samyria Rudie Liska is a 71 y.o. female with PMH of HTN, DM, HLD admitted in [place], [place] on 06/21/16 for right MCA, MCA/PCA, and MCA/ACA watershed infarction. MRA head and neck showed multiple COW vessels involvement including occluded right PCA and right M1, and high grade stenosis of left M1 and A1. There is also occluded left VA. TTE EF > 65%. EKG shows sinus rhythm. LDL 160 and A1C 11.6. She is on ASA 81mg  and plavix and crestor. Her stroke most likely due to large vessel occlusion/stenosis due to uncontrolled HTN, DM and HLD. Will further pursue CTA head and neck as her MRA head and neck images not good enough for detailed evaluation. Imaging study also showed 2.82.52 cm polyp mass within the sphenoid sinus with destructive changes to floor of sellar and sphenoid sinus, concerning for pituitary adenoma with growth  into the sphenoid sinus and also invasion of the right cavernous sinus. Follow up with ENT, had biopsy confirmed pituitary adenoma. She is following with Dr. Kathyrn Jenkins to repeat MRI and potential surgery. DM under control recent A1C 5.9. BP better controlled. On plavix.The patient is a current patient of Dr. Erlinda Jenkins  who is out of the  office today . This note is sent to the work in doctor.     Plan:Stressed the importance of management of risk factors to prevent further stroke Continue Plavix for secondary stroke prevention Maintain strict control of hypertension with blood pressure goal not too low due to intracranial vessel occlusion, BP goal is 130-150/70-90. Todays reading 149/80 Control of diabetes with hemoglobin A1c below 6.5 followed by primary care most recent hemoglobin A1c 5.9  continue diabetic medications Cholesterol with LDL cholesterol less than 70, followed by primary care,  continue statin drug Crestor  Exercise by walking, at least 30 minutes daily eat healthy diet with whole grains,  fresh fruits and vegetables Follow up with Dr. Kathyrn Jenkins for continued surveillance  pituitary adenoma management  Follow up with your primary care physician for stroke risk factor modification. Recommend maintain blood pressure goal 130-150/70-90, diabetes with hemoglobin A1c goal below 7.0% and lipids with LDL cholesterol goal below 70 mg/dL.  Will discharge from stroke clinic I spent 25 minutes in total face to face time with the patient more than 50% of which was spent counseling and coordination of care, reviewing test results reviewing medications and discussing and reviewing the diagnosis of stroke and management of risk factors.  Written information given and reviewed Dennie Bible, [facility], [facility], APRN  [facility] [address] [place], [place] [address] [phone]

## 2017-04-26 ENCOUNTER — Ambulatory Visit (INDEPENDENT_AMBULATORY_CARE_PROVIDER_SITE_OTHER): Payer: Medicare Other | Admitting: Nurse Practitioner

## 2017-04-26 ENCOUNTER — Encounter: Payer: Self-pay | Admitting: Nurse Practitioner

## 2017-04-26 VITALS — BP 149/80 | HR 73 | Wt 156.2 lb

## 2017-04-26 DIAGNOSIS — I63513 Cerebral infarction due to unspecified occlusion or stenosis of bilateral middle cerebral arteries: Secondary | ICD-10-CM | POA: Diagnosis not present

## 2017-04-26 DIAGNOSIS — Z794 Long term (current) use of insulin: Secondary | ICD-10-CM

## 2017-04-26 DIAGNOSIS — I10 Essential (primary) hypertension: Secondary | ICD-10-CM | POA: Diagnosis not present

## 2017-04-26 DIAGNOSIS — E1159 Type 2 diabetes mellitus with other circulatory complications: Secondary | ICD-10-CM

## 2017-04-26 DIAGNOSIS — E782 Mixed hyperlipidemia: Secondary | ICD-10-CM

## 2017-04-26 NOTE — Patient Instructions (Addendum)
Stressed the importance of management of risk factors to prevent further stroke Continue Plavix for secondary stroke prevention Maintain strict control of hypertension with blood pressure goal not too low due to intracranial vessel occlusion, BP goal is 130-150/70-90. Todays reading 149/80 Control of diabetes with hemoglobin A1c below 6.5 followed by primary care most recent hemoglobin A1c 5.9  continue diabetic medications Cholesterol with LDL cholesterol less than 70, followed by primary care,  continue statin drug Crestor  Exercise by walking, at least 30 minutes daily eat healthy diet with whole grains,  fresh fruits and vegetables Follow up with Dr. Kathyrn Sheriff for continued surveillance  pituitary adenoma management  Follow up with your primary care physician for stroke risk factor modification. Recommend maintain blood pressure goal 130-150/70-90, diabetes with hemoglobin A1c goal below 7.0% and lipids with LDL cholesterol goal below 70 mg/dL.  Will discharge from stroke clinic Stroke Prevention Some medical conditions and behaviors are associated with an increased chance of having a stroke. You may prevent a stroke by making healthy choices and managing medical conditions. How can I reduce my risk of having a stroke?  Stay physically active. Get at least 30 minutes of activity on most or all days.  Do not smoke. It may also be helpful to avoid exposure to secondhand smoke.  Limit alcohol use. Moderate alcohol use is considered to be: ? No more than 2 drinks per day for men. ? No more than 1 drink per day for nonpregnant women.  Eat healthy foods. This involves: ? Eating 5 or more servings of fruits and vegetables a day. ? Making dietary changes that address high blood pressure (hypertension), high cholesterol, diabetes, or obesity.  Manage your cholesterol levels. ? Making food choices that are high in fiber and low in saturated fat, trans fat, and cholesterol may control cholesterol  levels. ? Take any prescribed medicines to control cholesterol as directed by your health care provider.  Manage your diabetes. ? Controlling your carbohydrate and sugar intake is recommended to manage diabetes. ? Take any prescribed medicines to control diabetes as directed by your health care provider.  Control your hypertension. ? Making food choices that are low in salt (sodium), saturated fat, trans fat, and cholesterol is recommended to manage hypertension. ? Ask your health care provider if you need treatment to lower your blood pressure. Take any prescribed medicines to control hypertension as directed by your health care provider. ? If you are 41-46 years of age, have your blood pressure checked every 3-5 years. If you are 108 years of age or older, have your blood pressure checked every year.  Maintain a healthy weight. ? Reducing calorie intake and making food choices that are low in sodium, saturated fat, trans fat, and cholesterol are recommended to manage weight.  Stop drug abuse.  Avoid taking birth control pills. ? Talk to your health care provider about the risks of taking birth control pills if you are over 100 years old, smoke, get migraines, or have ever had a blood clot.  Get evaluated for sleep disorders (sleep apnea). ? Talk to your health care provider about getting a sleep evaluation if you snore a lot or have excessive sleepiness.  Take medicines only as directed by your health care provider. ? For some people, aspirin or blood thinners (anticoagulants) are helpful in reducing the risk of forming abnormal blood clots that can lead to stroke. If you have the irregular heart rhythm of atrial fibrillation, you should be on a blood  thinner unless there is a good reason you cannot take them. ? Understand all your medicine instructions.  Make sure that other conditions (such as anemia or atherosclerosis) are addressed. Get help right away if:  You have sudden weakness  or numbness of the face, arm, or leg, especially on one side of the body.  Your face or eyelid droops to one side.  You have sudden confusion.  You have trouble speaking (aphasia) or understanding.  You have sudden trouble seeing in one or both eyes.  You have sudden trouble walking.  You have dizziness.  You have a loss of balance or coordination.  You have a sudden, severe headache with no known cause.  You have new chest pain or an irregular heartbeat. Any of these symptoms may represent a serious problem that is an emergency. Do not wait to see if the symptoms will go away. Get medical help at once. Call your local emergency services (911 in [place] Do not drive yourself to the hospital. This information is not intended to replace advice given to you by your health care provider. Make sure you discuss any questions you have with your health care provider. Document Released: 06/21/2004 Document Revised: 10/20/2015 Document Reviewed: 11/14/2012 Elsevier Interactive Patient Education  2017 [employer].

## 2017-04-26 NOTE — Progress Notes (Signed)
I have reviewed and agreed above plan. 

## 2017-05-13 ENCOUNTER — Ambulatory Visit
Admission: RE | Admit: 2017-05-13 | Discharge: 2017-05-13 | Disposition: A | Discharge status: Home / Self Care | Payer: Medicare Other | Source: Ambulatory Visit | Attending: Neurosurgery | Admitting: Neurosurgery

## 2017-05-13 DIAGNOSIS — D352 Benign neoplasm of pituitary gland: Secondary | ICD-10-CM

## 2017-05-13 MED ORDER — GADOBENATE DIMEGLUMINE 529 MG/ML IV SOLN
7.0000 mL | Freq: Once | INTRAVENOUS | Status: AC | PRN
  Administered 2017-05-13: 7 mL via INTRAVENOUS

## 2017-09-25 ENCOUNTER — Ambulatory Visit (INDEPENDENT_AMBULATORY_CARE_PROVIDER_SITE_OTHER): Payer: Medicare Other | Admitting: Neurology

## 2017-09-25 ENCOUNTER — Encounter: Payer: Self-pay | Admitting: Neurology

## 2017-09-25 VITALS — BP 144/74 | HR 81 | Ht 62.0 in | Wt 158.4 lb

## 2017-09-25 DIAGNOSIS — D352 Benign neoplasm of pituitary gland: Secondary | ICD-10-CM | POA: Diagnosis not present

## 2017-09-25 DIAGNOSIS — I10 Essential (primary) hypertension: Secondary | ICD-10-CM | POA: Diagnosis not present

## 2017-09-25 DIAGNOSIS — E1159 Type 2 diabetes mellitus with other circulatory complications: Secondary | ICD-10-CM

## 2017-09-25 DIAGNOSIS — E782 Mixed hyperlipidemia: Secondary | ICD-10-CM

## 2017-09-25 DIAGNOSIS — R55 Syncope and collapse: Secondary | ICD-10-CM | POA: Diagnosis not present

## 2017-09-25 DIAGNOSIS — I63513 Cerebral infarction due to unspecified occlusion or stenosis of bilateral middle cerebral arteries: Secondary | ICD-10-CM | POA: Diagnosis not present

## 2017-09-25 DIAGNOSIS — Z794 Long term (current) use of insulin: Secondary | ICD-10-CM

## 2017-09-25 NOTE — Patient Instructions (Signed)
-   continue plavix and crestor for stroke prevention - check BP and glucose at home and record. Avoid low BP and glucose  - BP goal not too low due to intracranial vessel occlusion, BP gaol is 130-150/70-90.  - follow up with Dr. Kathyrn Sheriff for pituitary adenoma management  - Follow up with your primary care physician for stroke risk factor modification. Recommend maintain blood pressure goal 130-150/70-90, diabetes with hemoglobin A1c goal below 7.0% and lipids with LDL cholesterol goal below 70 mg/dL.  - will check EEG to rule out seizure - diabetic diet and regular exercise  - follow up in 3 months with Claudia Jenkins.

## 2017-09-25 NOTE — Progress Notes (Signed)
NEUROLOGY CLINIC NEW PATIENT NOTE  NAME: Claudia Jenkins DOB: 07/14/1945 REFERRING PHYSICIAN: Velna Hatchet, MD  I saw Claudia Jenkins as a new consult in the neurovascular clinic today regarding  Chief Complaint  Patient presents with  . Follow-up    Room 2. h/o CVA. Patient reports that she has been doing "fair"  .  HPI: Claudia Jenkins is a 72 y.o. female with PMH of HTN, DM, HLD who presents as a new patient for a stroke.   Pt was on vacation in [place], [place] when she presented to [facility] on 06/21/16 for confusion for several days and left arm weakness for 3 days. As per husband, patient did not recognize her own car, take 20 minutes to complete things that normally will take her 30 seconds to complete. Patient seemed to have changes in vision because she was bumping into things at home. CT and MRI brain showed right MCA, MCA/PCA, and MCA/ACA watershed infarction. MRA head and neck showed multiple COW vessels involvement including occluded right PCA and right M1, and high grade stenosis of left M1 and A1. There is also occluded left VA. TTE EF > 65%. EKG shows sinus rhythm. LDL 160 and A1C 11.6. Local neurology was consulted and recommended DAPT. However, pt was discharge with ASA only along with crestor.   Back to [place], followed with PCP, put back on ASA 81mg  and plavix. Continued crestor. Referred here for further neurology follow up. Pt denies smoking or alcohol use. She was not aware that she has DM, HLD. She had HTN in the past but off BP meds due to BP in control. However, her BP was high during hospitatlization. Now on losartan, dose increased from 50mg  to 100mg .   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 ENT referral has been requested.  Cardiology  referral also made due to HTN and left ventricular hypertrophy showing on TTE.     Follow up 10/24/16 - pt has been doing well. No stroke like symptoms. She followed with PCP and got DM under control. Her recent A1C was 6.6. Glucose at home from 70-120s. BP today 161/88. She followed with ENT and had biopsy confirmed pituitary adenoma, followed with endocrinology and NSG, will do MRI again for evaluation. She follows with Dr. Kathyrn Sheriff at [facility]. She also followed with Dr. Einar Gip in cardiology and was told everything is fine except BP not in good control. She complain of fatigue and muscle cramps from crestor 40.   Follow up 04/26/17 (CM) - Claudia Jenkins, 72 year old female returns for follow-up after having stroke event in January 2018.  She is currently on Plavix without recurrent stroke or TIA symptoms.  She has no bleeding and no bruising.  She reports most recent hemoglobin A1c 5.9, she continues her diabetic medications.  She is on Crestor for hyperlipidemia without myalgias.  She claims she walks every day for exercise.  She continues to follow with Dr. Kathyrn Sheriff at [facility] for pituitary adenoma. She is also followed by Dr. Einar Gip cardiology.  She returns for reevaluation. She has no new neurologic symptoms  Interval history: During the interval time, she followed with Dr. Kathyrn Sheriff and had MRI brain on 04/2017 showed slightly decreased size of pituitary adenoma. Currently continue conservative management.   However, pt and  husband complains of episodes of generalized weakness. First episode about 6 months ago, she was in church, suddenly getting weak, about to fall, she was put in bed and lay down. Episode lasted 26min resolved. She denies HA or LOC, remembered everything. Second episode was 2 months ago, again in bathroom, felt generalized weakness, husband helped her to bed, lay down, symptoms lasted about 15-78min. 3rd episode was 2 weeks ago, she was cooking breakfast,  same feeling came on, she needed go to bed, after 15-20 min symptoms resolved. No HA, no visual changes. She check this time glucose was 81. She said her glucose usually at 80s at home. However, for all 3 times, no blood pressure taken. Her metoprolol from 100mg  down to 50mg . Today BP in clinic 144/74.   Husband also complains pt had difficulty with distance judgement, made her difficulty driving a car or getting cross the street. Husband also complains pt short term memory difficulty.    Past Medical History:  Diagnosis Date  . Diabetes mellitus without complication ([place])   . High cholesterol   . Hypertension   . Stroke [facility]) 05/2016   Past Surgical History:  Procedure Laterality Date  . BACK SURGERY    . BREAST BIOPSY  2018  . NASAL SINUS SURGERY N/A 08/29/2016   Procedure: NASAL ENDOSCOPY WITH BIOPSY OF SPHENOID AND CLIVIUS MASS;  Surgeon: Izora Gala, MD;  Location: [facility] OR;  Service: ENT;  Laterality: N/A;   Family History  Problem Relation Age of Onset  . Stroke Sister    Current Outpatient Medications  Medication Sig Dispense Refill  . amLODipine (NORVASC) 10 MG tablet Take 10 mg by mouth daily.    . clopidogrel (PLAVIX) 75 MG tablet   1  . insulin glargine (LANTUS) 100 UNIT/ML injection Inject 35 Units into the skin at bedtime.     [name] JARDIANCE 10 MG TABS tablet TK 1 T PO D  1  . linaclotide (LINZESS) 72 MCG capsule Take 72 mcg by mouth daily as needed (constipation).    [name] losartan (COZAAR) 100 MG tablet TK 1 T PO D  3  . metFORMIN (GLUCOPHAGE) 500 MG tablet TK 1 T PO BID  3  . metoprolol succinate (TOPROL-XL) 100 MG 24 hr tablet Take 100 mg by mouth every evening. Take with or immediately following a meal.    . rosuvastatin (CRESTOR) 20 MG tablet TK 1 T PO D  1   No current facility-administered medications for this visit.    Allergies  Allergen Reactions  . Latex Dermatitis and Other (See Comments)    Very uncomfortable feeling when using latex gloves ,irritation  .  Penicillins Rash    Has patient had a PCN reaction causing immediate rash, facial/tongue/throat swelling, SOB or lightheadedness with hypotension: #  #  #  YES  #  #  #  Has patient had a PCN reaction causing severe rash involving mucus membranes or skin necrosis: No Has patient had a PCN reaction that required hospitalization No Has patient had a PCN reaction occurring within the last 10 years: No If all of the above answers are "NO", then may proceed with Cephalosporin use.     Social History   Socioeconomic History  . Marital status: Married    Spouse name: Not on file  . Number of children: Not on file  . Years of education: Not on file  . Highest education level: Not on file  Occupational History  . Not on file  Social  Needs  . Financial resource strain: Not on file  . Food insecurity:    Worry: Not on file    Inability: Not on file  . Transportation needs:    Medical: Not on file    Non-medical: Not on file  Tobacco Use  . Smoking status: Never Smoker  . Smokeless tobacco: Never Used  Substance and Sexual Activity  . Alcohol use: No  . Drug use: No  . Sexual activity: Not on file  Lifestyle  . Physical activity:    Days per week: Not on file    Minutes per session: Not on file  . Stress: Not on file  Relationships  . Social connections:    Talks on phone: Not on file    Gets together: Not on file    Attends religious service: Not on file    Active member of club or organization: Not on file    Attends meetings of clubs or organizations: Not on file    Relationship status: Not on file  . Intimate partner violence:    Fear of current or ex partner: Not on file    Emotionally abused: Not on file    Physically abused: Not on file    Forced sexual activity: Not on file  Other Topics Concern  . Not on file  Social History Narrative  . Not on file    Review of Systems Full 14 system review of systems performed and notable only for those listed, all others are  neg:  Constitutional:  fatigue Cardiovascular:  Ear/Nose/Throat:   Skin:  Eyes:   Respiratory:   Gastroitestinal: constipation Genitourinary:  Hematology/Lymphatic:   Endocrine:  Musculoskeletal:  Muscle cramps Allergy/Immunology:   Neurological: weakness, passing out  Psychiatric:  Sleep: insomnia, daytime sleepiness   Physical Exam  Vitals:   09/25/17 1511  BP: (!) 144/74  Pulse: 81    General - Well nourished, well developed, in no apparent distress.  Ophthalmologic - Sharp disc margins OU.  Cardiovascular - Regular rate and rhythm with no murmur.    Neck - supple, no nuchal rigidity .  Mental Status -  Level of arousal and orientation to time, place, and person were intact. Language including expression, naming, repetition, comprehension, reading, and writing was assessed and found intact. Attention span and concentration were normal. Delayed recall 2.5/3.  Fund of Knowledge was assessed and was intact.  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 III, IV, VI - Extraocular movements intact. V - Facial sensation intact bilaterally. VII - Facial movement intact bilaterally. VIII - Hearing & vestibular intact bilaterally. X - Palate elevates symmetrically. XI - Chin turning & shoulder shrug intact bilaterally. XII - Tongue protrusion intact.  Motor Strength - The patient's strength was normal in all extremities and pronator drift was absent.  Bulk was normal and fasciculations were absent.   Motor Tone - Muscle tone was assessed at the neck and appendages and was normal.  Reflexes - The patient's reflexes were normal in all extremities and she had no pathological reflexes.  Sensory - Light touch, temperature/pinprick were assessed and were decreased on the left arm.    Coordination - The patient had normal movements in the hands and feet with no ataxia or dysmetria.  Tremor was absent.  Gait and Station - The patient's transfers,  posture, gait, station, and turns were observed as normal.   Imaging  I have personally reviewed the radiological images below and agree with  the radiology interpretations.  MRI brain without contrast 06/25/2016 Expected evolution of right cerebral hemisphere watershed vascular territory infarcts. No new restriction diffusion. Associated FLAIR signal. Stable associated local mass effect. No progressive midline shift. No evidence for hemorrhagic transformation. White matter signal abnormality likely reflective of chronic microvascular angiopathic change. Age appropriate brain parenchymal volume loss.  CT head without contrast 06/24/2016 Acute lacunar infarct within the left thalamus. Unchanged nonhemorrhagic subacute infarct in the right frontal and parieto-occipital lobes. Sphenoid sinus mass as described on the previous sinus MRI  CT head without contrast 06/23/2016 Stable areas of infarction in the right cerebral hemisphere likely subacute in the right frontal and parietoccipital lobes. no hemorrhage or mass effect. Stable lesion in the sphenoids in sinus.  MRA neck without contrast 06/22/2016 Left vertebral artery is occluded at its origin. No other hemodynamically significant stenosis of major arteries of the neck  MRA of the head without contrast 06/22/2016 Occluded left vertebral artery. Occluded right PCA. Occluded right M1 segment with reconstitution of distal branches. High-grade stenosis of the left A1 and M1 segments  MRI sinus/brain with and without contrast 06/22/2016 1. Approximately 2.82.52 cm polyp mass, predominantly located within the posterior aspect of the sphenoid sinus. There are large areas of bony destruction present involving the floor of the sella, and a mass is contiguous with portions of the pituitary gland. This likely reflect of mama normal with no pituitary adenoma with growth into the sphenoid sinus. There is also destruction involving the floor of the  sphenoid sinuses. There is concern for invasion of the right cavernous sinus. Neurosurgical/ENT evaluation is recommended. 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 The optic chiasm and hypothalamus extend inferiorly into the superior aspect of the expanded sella. 2. multiple areas of enhancing early subacute infarction involving the watershed territory of the right cerebral hemisphere, as discussed above. Likely associated with minimal petechial hemorrhagic transformation associate with the infarct involving the lateral right occipital lobe. No associated significant mass effect or mass producing hematoma. Close clinical and/or imaging follow-up recommended. 3. normal unilateral increased T1 signal involving the right basal ganglia. This finding has been described in the setting of nonketotic hyperglycemia. Correlate clinically.  CT head without contrast 06/21/2016 1. Wedge-shaped hypodensity involving the right parietal occipital region concerning for evolving subacute infarct within the right MCA/PCA watershed territory. There is no hemorrhagic transformation. 2. age appropriate involutional changes of the brain with mild chronic microvascular ischemic change. 3. nonspecific soft tissue opacification within the sphenoid sinuses as described above. There are bony destructive changes which involving the floor of the sella as well as the floor of the sphenoid sinuses. The bony destructive changes raises suspicion for an underlying aggressive processes, such as sphenoid sinus mass lesion or invasive sinus disease. Recommend ENT consultation with direct visualization. Could also consider further evaluation with MRI of the sinus with and without contrast. Will will of the following  TTE  Moderate to severe concentric left ventricular hypertrophy is observed. There is a normal left ventricular systolic function. The estimated ejection fraction is greater than 65%. Abnormal left ventricular dystonic feeling is  observed, consistent with impaired relaxation. There is a minimal aortic stenosis present. The peak instantaneous gradient of the aortic valve is 16 mmHg. There is a aortic annular calcification. Moderate aortic leaflet calcification is visualized. There is mitral annular calcification. There is a trace of mitral regurgitation. There is a trace tricuspid Regurgitation. The Right Ventricular Systolic Pressure Is Calculated at 17 MmHg.  CTA head and neck  Chronic watershed infarcts on the right involving the right anterior and middle cerebral arteries. Carotid bifurcation widely patent bilaterally. Occluded proximal left vertebral artery with reconstitution of a small left vertebral artery which appears to end prior to the basilar. Right vertebral artery patent to the basilar. Severe stenosis right MCA bifurcation. Severe stenosis proximal left A1 segment. Mild stenosis proximal left M1 segment. Occluded right posterior cerebral artery. Moderate stenosis proximal left posterior cerebral artery.  Enlargement of the sella which is filled with CSF. Opacification of the sphenoid sinus with erosion of the floor of the sella and floor of the sphenoid sinus. This could be related to prior trans-sphenoidal surgery. If no surgery has been performed, this would be suspicious for sphenoid sinus neoplasm or aggressive infection. No intracranial mass lesion identified  MRI brain with and without contrast Residual pituitary adenoma, in the skull base, and extending into both cavernous sinuses, greater on the RIGHT. Compared with June 2018, there appears to have been slight decrease in overall size of the tumor. Continued surveillance is warranted.  Lab Review 06/21/2016 WBC 12.15 platelet 364 hemoglobin 13.9  total cholesterol 250, TG 156, HDL 59, LDL 160 Glucose 199, creatinine 0.59, ALT 31, AST 23, hemoglobin A1c 11.6   Assessment:   In summary, Claudia Jenkins is a 72 y.o. female with PMH of  HTN, DM, HLD admitted in [place], [place] on 06/21/16 for right MCA, MCA/PCA, and MCA/ACA watershed infarction. MRA head and neck showed multiple COW vessels involvement including occluded right PCA and right M1, and high grade stenosis of left M1 and A1. There is also occluded left VA. TTE EF > 65%. EKG shows sinus rhythm. LDL 160 and A1C 11.6. She is on ASA 81mg  and plavix and crestor. Her stroke most likely due to large vessel occlusion/stenosis due to uncontrolled HTN, DM and HLD. Had CTA head and neck showed multifocal intracranial stenosis. 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 Follow up with ENT, had biopsy confirmed pituitary adenoma. She is following with Dr. Kathyrn Sheriff and repeated MRI showed slight decrease size of pituitary adenoma. Glucose at home was low at 80s. She did not check BP at home.  Finished 3 months of DAPT, will put on plavix.  For the last 6 months, pt had 3 episode of generalized weakness, and had to lie down and it resolved in 67min. Last episode, she check this time glucose was 81. She said her glucose usually at 80s at home. However, for all 3 times, no blood pressure taken. Her metoprolol from 100mg  down to 50mg . Today BP in clinic 144/74. Husband also complains pt had difficulty with distance judgement, made her difficulty driving a car or getting cross the street. Husband also complains pt short term memory difficulty. However, on neuro exam, no sign of dementia.    Plan: - continue plavix and crestor for stroke prevention - check BP and glucose at home and record. Avoid low BP and glucose  - BP goal not too low due to intracranial vessel occlusion, BP gaol is 130-150/70-90.  - follow up with Dr. Kathyrn Sheriff for pituitary adenoma management  - Follow up with your  primary care physician for stroke risk factor modification. Recommend maintain blood pressure goal 130-150/70-90, diabetes with hemoglobin A1c goal below 7.0% and lipids with LDL cholesterol goal below  70 mg/dL.  - will check EEG to rule out seizure - diabetic diet and regular exercise  - follow up in 3 months with Jessicca.   I spent more than 25 minutes of face to face time with the patient. Greater than 50% of time was spent in counseling and coordination of care. We discussed BP and DM monitoring and control, follow up with NSG, EEG.  Orders Placed This Encounter  Procedures  . EEG adult    Standing Status:   Future    Standing Expiration Date:   09/26/2018    Order Specific Question:   Where should this test be performed?    Answer:   [facility]    No orders of the defined types were placed in this encounter.   Patient Instructions  - continue plavix and crestor for stroke prevention - check BP and glucose at home and record. Avoid low BP and glucose  - BP goal not too low due to intracranial vessel occlusion, BP gaol is 130-150/70-90.  - follow up with Dr. Kathyrn Sheriff for pituitary adenoma management  - Follow up with your primary care physician for stroke risk factor modification. Recommend maintain blood pressure goal 130-150/70-90, diabetes with hemoglobin A1c goal below 7.0% and lipids with LDL cholesterol goal below 70 mg/dL.  - will check EEG to rule out seizure - diabetic diet and regular exercise  - follow up in 3 months with Jessicca.    Rosalin Hawking, MD PhD [facility] [address] [place], [place] [address] [phone]

## 2017-09-27 DIAGNOSIS — R55 Syncope and collapse: Secondary | ICD-10-CM | POA: Insufficient documentation

## 2017-10-23 ENCOUNTER — Other Ambulatory Visit: Payer: Medicare Other

## 2017-11-12 ENCOUNTER — Other Ambulatory Visit: Payer: Medicare Other

## 2017-11-20 ENCOUNTER — Encounter: Payer: Self-pay | Admitting: Gastroenterology

## 2017-11-26 ENCOUNTER — Other Ambulatory Visit (INDEPENDENT_AMBULATORY_CARE_PROVIDER_SITE_OTHER): Payer: Medicare Other

## 2017-11-26 DIAGNOSIS — R55 Syncope and collapse: Secondary | ICD-10-CM

## 2017-12-06 ENCOUNTER — Other Ambulatory Visit: Payer: Self-pay | Admitting: Neurology

## 2017-12-06 DIAGNOSIS — R55 Syncope and collapse: Secondary | ICD-10-CM

## 2017-12-10 ENCOUNTER — Telehealth: Payer: Self-pay

## 2017-12-10 NOTE — Telephone Encounter (Signed)
Notes recorded by Marval Regal, RN on 12/10/2017 at 4:45 PM EDT Rn call patient that the EEG was negative, normal study, no seizure activity. Continue treatment plan. Pt verbalized understanding. ------

## 2017-12-10 NOTE — Telephone Encounter (Signed)
-----   Message from Rosalin Hawking, MD sent at 12/08/2017  7:41 AM EDT ----- Could you please let the patient know that the EEG test done recently in our office was normal study, no seizure activity. Please continue current treatment. Thanks.  Rosalin Hawking, MD PhD Stroke Neurology 12/08/2017 7:41 AM

## 2017-12-26 ENCOUNTER — Encounter: Payer: Self-pay | Admitting: Adult Health

## 2017-12-26 ENCOUNTER — Ambulatory Visit (INDEPENDENT_AMBULATORY_CARE_PROVIDER_SITE_OTHER): Payer: Medicare Other | Admitting: Adult Health

## 2017-12-26 VITALS — BP 150/82 | HR 71 | Ht 62.0 in | Wt 158.2 lb

## 2017-12-26 DIAGNOSIS — E785 Hyperlipidemia, unspecified: Secondary | ICD-10-CM | POA: Diagnosis not present

## 2017-12-26 DIAGNOSIS — I10 Essential (primary) hypertension: Secondary | ICD-10-CM

## 2017-12-26 DIAGNOSIS — Z794 Long term (current) use of insulin: Secondary | ICD-10-CM

## 2017-12-26 DIAGNOSIS — E1159 Type 2 diabetes mellitus with other circulatory complications: Secondary | ICD-10-CM

## 2017-12-26 DIAGNOSIS — Z8673 Personal history of transient ischemic attack (TIA), and cerebral infarction without residual deficits: Secondary | ICD-10-CM | POA: Diagnosis not present

## 2017-12-26 NOTE — Patient Instructions (Signed)
Continue clopidogrel 75 mg daily  and crestor  for secondary stroke prevention  Continue to follow up with PCP regarding cholesterol, blood pressure and diabetes management   Follow up with Dr. Kathyrn Sheriff as scheduled  Continue to stay active and maintain a healthy diet  Continue to monitor blood pressure at home  Maintain strict control of hypertension with blood pressure goal below 130/90, diabetes with hemoglobin A1c goal below 6.5% and cholesterol with LDL cholesterol (bad cholesterol) goal below 70 mg/dL. I also advised the patient to eat a healthy diet with plenty of whole grains, cereals, fruits and vegetables, exercise regularly and maintain ideal body weight.  Followup in the future with me as needed or call earlier if needed       Thank you for coming to see [place] at [facility]. I hope we have been able to provide you high quality care today.  You may receive a patient satisfaction survey over the next few weeks. We would appreciate your feedback and comments so that we may continue to improve ourselves and the health of our patients.

## 2017-12-26 NOTE — Progress Notes (Signed)
NEUROLOGY CLINIC NEW PATIENT NOTE  NAME: Claudia Jenkins DOB: Oct 16, 1945 REFERRING PHYSICIAN: Velna Hatchet, MD  I saw Claudia Jenkins as a new consult in the neurovascular clinic today regarding  Chief Complaint  Patient presents with  . Follow-up    Stroke and syncope episode with Claudia Jenkins her husband  .  HPI: Claudia Jenkins is a 72 y.o. female with PMH of HTN, DM, HLD who presents as a new patient for a stroke.   Pt was on vacation in [place], [place] when she presented to [facility] on 06/21/16 for confusion for several days and left arm weakness for 3 days. As per husband, patient did not recognize her own car, take 20 minutes to complete things that normally will take her 30 seconds to complete. Patient seemed to have changes in vision because she was bumping into things at home. CT and MRI brain showed right MCA, MCA/PCA, and MCA/ACA watershed infarction. MRA head and neck showed multiple COW vessels involvement including occluded right PCA and right M1, and high grade stenosis of left M1 and A1. There is also occluded left VA. TTE EF > 65%. EKG shows sinus rhythm. LDL 160 and A1C 11.6. Local neurology was consulted and recommended DAPT. However, pt was discharge with ASA only along with crestor.  Back to [place], followed with PCP, put back on ASA 81mg  and plavix. Continued crestor. Referred here for further neurology follow up. Pt denies smoking or alcohol use. She was not aware that she has DM, HLD. She had HTN in the past but off BP meds due to BP in control. However, her BP was high during hospitatlization. Now on losartan, dose increased from 50mg  to 100mg .  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 ENT referral has been requested. Cardiology referral also made due  to HTN and left ventricular hypertrophy showing on TTE.     Follow up 10/24/16 -follow-up appointment overall doing well  Follow up 04/26/17 (CM) -follow-up appointment overall continues to do well  Follow up 09/25/17 JX: During the interval time, she followed with Dr. Kathyrn Sheriff and had MRI brain on 04/2017 showed slightly decreased size of pituitary adenoma. Currently continue conservative management.  However, pt and husband complains of episodes of generalized weakness. First episode about 6 months ago, she was in church, suddenly getting weak, about to fall, she was put in bed and lay down. Episode lasted 38min resolved. She denies HA or LOC, remembered everything. Second episode was 2 months ago, again in bathroom, felt generalized weakness, husband helped her to bed, lay down, symptoms lasted about 15-38min. 3rd episode was 2 weeks ago, she was cooking breakfast, same feeling came on, she needed go to bed, after 15-20 min symptoms resolved. No HA, no visual changes. She check this time glucose was 81. She said her glucose usually at 80s at home. However, for all 3 times, no blood pressure taken. Her metoprolol from 100mg  down to 50mg . Today BP in clinic 144/74.  Husband also complains pt had difficulty with distance judgement, made her difficulty driving a car or getting cross the street. Husband also complains pt short term memory difficulty.   Interval History: Patient is being seen today for scheduled follow-up appointment.  She did undergo EEG as recommended after previous appointment and was  negative for seizure activity.  Patient did have blood pressure medications lowered and since that time patient denies additional episodes of weakness or dizziness.  She continues to monitor blood pressure at home and typical SBP 130 and at today's appointment 150/82.  She continues to monitor glucose at home and states her levels have been good.  Continues to take Plavix without side effects of bleeding or  bruising.  Continues to take Crestor without side effects myalgias.  PCP does monitor lipid levels.  Denies new or worsening stroke/TIA symptoms.    Past Medical History:  Diagnosis Date  . Diabetes mellitus without complication ([place])   . High cholesterol   . Hypertension   . Stroke [facility]) 05/2016   Past Surgical History:  Procedure Laterality Date  . BACK SURGERY    . BREAST BIOPSY  2018  . NASAL SINUS SURGERY N/A 08/29/2016   Procedure: NASAL ENDOSCOPY WITH BIOPSY OF SPHENOID AND CLIVIUS MASS;  Surgeon: Izora Gala, MD;  Location: [facility] OR;  Service: ENT;  Laterality: N/A;   Family History  Problem Relation Age of Onset  . Stroke Sister    Current Outpatient Medications  Medication Sig Dispense Refill  . amLODipine (NORVASC) 10 MG tablet Take 10 mg by mouth daily.    . clopidogrel (PLAVIX) 75 MG tablet   1  . escitalopram (LEXAPRO) 10 MG tablet 20mg   5  . insulin glargine (LANTUS) 100 UNIT/ML injection Inject 35 Units into the skin at bedtime.     [name] JARDIANCE 10 MG TABS tablet TK 1 T PO D  1  . linaclotide (LINZESS) 72 MCG capsule Take 72 mcg by mouth daily as needed (constipation).    [name] losartan (COZAAR) 100 MG tablet 50mg   3  . metFORMIN (GLUCOPHAGE) 500 MG tablet TK 1 T PO BID  3  . metoprolol succinate (TOPROL-XL) 100 MG 24 hr tablet Take 100 mg by mouth every evening. Take with or immediately following a meal.    . rosuvastatin (CRESTOR) 20 MG tablet TK 1 T PO D  1  . zolpidem (AMBIEN) 5 MG tablet TK 1 T PO QHS PRN INSOMNIA  2   No current facility-administered medications for this visit.    Allergies  Allergen Reactions  . Latex Dermatitis and Other (See Comments)    Very uncomfortable feeling when using latex gloves ,irritation  . Penicillins Rash    Has patient had a PCN reaction causing immediate rash, facial/tongue/throat swelling, SOB or lightheadedness with hypotension: #  #  #  YES  #  #  #  Has patient had a PCN reaction causing severe rash involving mucus  membranes or skin necrosis: No Has patient had a PCN reaction that required hospitalization No Has patient had a PCN reaction occurring within the last 10 years: No If all of the above answers are "NO", then may proceed with Cephalosporin use.     Social History   Socioeconomic History  . Marital status: Married    Spouse name: Not on file  . Number of children: Not on file  . Years of education: Not on file  . Highest education level: Not on file  Occupational History  . Not on file  Social Needs  . Financial resource strain: Not on file  . Food insecurity:    Worry: Not on file    Inability: Not on file  . Transportation needs:    Medical: Not on file    Non-medical: Not on file  Tobacco Use  .  Smoking status: Never Smoker  . Smokeless tobacco: Never Used  Substance and Sexual Activity  . Alcohol use: No  . Drug use: No  . Sexual activity: Not on file  Lifestyle  . Physical activity:    Days per week: Not on file    Minutes per session: Not on file  . Stress: Not on file  Relationships  . Social connections:    Talks on phone: Not on file    Gets together: Not on file    Attends religious service: Not on file    Active member of club or organization: Not on file    Attends meetings of clubs or organizations: Not on file    Relationship status: Not on file  . Intimate partner violence:    Fear of current or ex partner: Not on file    Emotionally abused: Not on file    Physically abused: Not on file    Forced sexual activity: Not on file  Other Topics Concern  . Not on file  Social History Narrative  . Not on file    Review of Systems Full 14 system review of systems performed and notable only for those listed, all others are neg:  No complaints   Physical Exam  Vitals:   12/26/17 1520  BP: (!) 150/82  Pulse: 71    General - Well nourished, well developed, pleasant elderly African-American female, in no apparent distress.  Ophthalmologic - Sharp  disc margins OU.  Cardiovascular - Regular rate and rhythm with no murmur.    Neck - supple, no nuchal rigidity .  Mental Status -  Level of arousal and orientation to time, place, and person were intact. Language including expression, naming, repetition, comprehension, reading, and writing was assessed and found intact. Attention span and concentration were normal. Fund of Knowledge was assessed and was intact.  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 III, IV, VI - Extraocular movements intact. V - Facial sensation intact bilaterally. VII - Facial movement intact bilaterally. VIII - Hearing & vestibular intact bilaterally. X - Palate elevates symmetrically. XI - Chin turning & shoulder shrug intact bilaterally. XII - Tongue protrusion intact.  Motor Strength - The patient's strength was normal in all extremities and pronator drift was absent.  Bulk was normal and fasciculations were absent.   Motor Tone - Muscle tone was assessed at the neck and appendages and was normal.  Reflexes - The patient's reflexes were normal in all extremities and she had no pathological reflexes.  Sensory - Light touch, temperature/pinprick were assessed and were decreased on the left arm.    Coordination - The patient had normal movements in the hands and feet with no ataxia or dysmetria.  Tremor was absent.  Gait and Station - The patient's transfers, posture, gait, station, and turns were observed as normal.   Imaging  I have personally reviewed the radiological images below and agree with the radiology interpretations.  EEG 12/06/2017  Summary Normal electroencephalogram, awake, asleep and with activation procedures. There are no focal lateralizing or epileptiform features.   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  During admission, her imaging showed polyp mass within this lymph nodes sinus for which she was seen by ENT and had a biopsy which confirmed pituitary adenoma.  She is followed by  Dr. Kathyrn Sheriff for this.  At previous visit, patient had complaints of generalized weakness episodes where an EEG was obtained and was negative for seizure activity.  Patient returns today for follow-up and overall is doing well.     Plan: - continue plavix and crestor for stroke prevention - check BP and glucose at home and record. Avoid low BP and glucose  - BP goal not too low due to intracranial vessel occlusion, BP gaol is 130-150/70-90.  - follow up with Dr. Kathyrn Sheriff for pituitary adenoma management  - Follow up with your primary care physician for stroke risk factor modification. Recommend maintain blood pressure goal 130-150/70-90, diabetes with hemoglobin A1c goal below 7.0% and lipids with LDL cholesterol goal below 70 mg/dL.  - diabetic diet and regular exercise   Follow-up as needed as patient stable from stroke standpoint or call with questions/concerns  I spent more than 25 minutes of face to face time with the patient. Greater than 50% of time was spent in counseling and coordination of care. We discussed BP and DM monitoring and control, follow up with NSG, EEG.  Venancio Poisson, AGNP-BC  [facility] [address] [place], [place] [address]  Phone [phone] Fax [phone] Note: This document was prepared with digital dictation and possible smart phrase technology. Any transcriptional errors that result from this process are unintentional.

## 2018-01-09 NOTE — Progress Notes (Signed)
I agree with the above plan 

## 2018-01-23 ENCOUNTER — Ambulatory Visit (INDEPENDENT_AMBULATORY_CARE_PROVIDER_SITE_OTHER): Payer: Medicare Other | Admitting: Gastroenterology

## 2018-01-23 ENCOUNTER — Telehealth: Payer: Self-pay

## 2018-01-23 ENCOUNTER — Encounter: Payer: Self-pay | Admitting: Gastroenterology

## 2018-01-23 VITALS — BP 140/84 | HR 64 | Ht 62.0 in | Wt 155.4 lb

## 2018-01-23 DIAGNOSIS — Z1211 Encounter for screening for malignant neoplasm of colon: Secondary | ICD-10-CM

## 2018-01-23 DIAGNOSIS — Z8673 Personal history of transient ischemic attack (TIA), and cerebral infarction without residual deficits: Secondary | ICD-10-CM

## 2018-01-23 MED ORDER — PEG-KCL-NACL-NASULF-NA ASC-C 140 G PO SOLR: 140.0000 g | ORAL | 0 refills | Status: DC

## 2018-01-23 NOTE — Patient Instructions (Signed)
If you are age 70 or older, your body mass index should be between 23-30. Your Body mass index is 28.42 kg/m. If this is out of the aforementioned range listed, please consider follow up with your Primary Care Provider.  If you are age 21 or younger, your body mass index should be between 19-25. Your Body mass index is 28.42 kg/m. If this is out of the aformentioned range listed, please consider follow up with your Primary Care Provider.   You have been scheduled for a colonoscopy. Please follow written instructions given to you at your visit today.  Please pick up your prep supplies at the pharmacy within the next 1-3 days. If you use inhalers (even only as needed), please bring them with you on the day of your procedure. Your physician has requested that you go to www.startemmi.com and enter the access code given to you at your visit today. This web site gives a general overview about your procedure. However, you should still follow specific instructions given to you by our office regarding your preparation for the procedure.  It was a pleasure to see you today!  Dr. Loletha Carrow

## 2018-01-23 NOTE — Telephone Encounter (Signed)
Yes, proceed with colonoscopy, holding plavix 5 days prior to procedure date.

## 2018-01-23 NOTE — Progress Notes (Signed)
[facility] Consult Note:  History: Claudia Jenkins 01/23/2018  Referring physician: Velna Hatchet, MD  Reason for consult/chief complaint: Colon Cancer Screening (on Plavix)   Subjective  HPI:  This is a very pleasant 72 year old woman referred by primary care for colon cancer screening.   She has not had a prior colonoscopy and has no family history of colorectal cancer.  She denies lower abdominal pain, altered bowel habits or rectal bleeding.  She denies upper digestive symptoms such as frequent heartburn, dysphagia, nausea, vomiting or early satiety.  Appetite is been good and weight stable.   ROS:  Review of Systems She denies chest pain dyspnea or dysuria Chronic left arm weakness since her CVA  Past Medical History: Past Medical History:  Diagnosis Date  . Depression   . Diabetes mellitus without complication ([place])   . High cholesterol   . Hypertension   . Stroke [facility]) 05/2016   Cardiology office note from November 2018 was reviewed.  The patient suffered a right hemisphere watershed infarction in January 2018.  She was then on DAPT.  She is now on just Plavix for secondary stroke prevention.  Past Surgical History: Past Surgical History:  Procedure Laterality Date  . BREAST BIOPSY Left 2018  . LUMBAR DISC SURGERY    . NASAL SINUS SURGERY N/A 08/29/2016   Procedure: NASAL ENDOSCOPY WITH BIOPSY OF SPHENOID AND CLIVIUS MASS;  Surgeon: Izora Gala, MD;  Location: [facility] OR;  Service: ENT;  Laterality: N/A;     Family History: Family History  Problem Relation Age of Onset  . Stroke Sister   . Diabetes Sister     Social History: Social History   Socioeconomic History  . Marital status: Married    Spouse name: Not on file  . Number of children: 1  . Years of education: Not on file  . Highest education level: Not on file  Occupational History  . Occupation: retired  Scientific laboratory technician  . Financial resource strain: Not on file  . Food  insecurity:    Worry: Not on file    Inability: Not on file  . Transportation needs:    Medical: Not on file    Non-medical: Not on file  Tobacco Use  . Smoking status: Never Smoker  . Smokeless tobacco: Never Used  Substance and Sexual Activity  . Alcohol use: No  . Drug use: No  . Sexual activity: Not Currently    Partners: Male    Birth control/protection: None  Lifestyle  . Physical activity:    Days per week: Not on file    Minutes per session: Not on file  . Stress: Not on file  Relationships  . Social connections:    Talks on phone: Not on file    Gets together: Not on file    Attends religious service: Not on file    Active member of club or organization: Not on file    Attends meetings of clubs or organizations: Not on file    Relationship status: Not on file  Other Topics Concern  . Not on file  Social History Narrative  . Not on file    Allergies: Allergies  Allergen Reactions  . Latex Dermatitis and Other (See Comments)    Very uncomfortable feeling when using latex gloves ,irritation  . Penicillins Rash    Has patient had a PCN reaction causing immediate rash, facial/tongue/throat swelling, SOB or lightheadedness with hypotension: #  #  #  YES  #  #  #  Has patient had a PCN reaction causing severe rash involving mucus membranes or skin necrosis: No Has patient had a PCN reaction that required hospitalization No Has patient had a PCN reaction occurring within the last 10 years: No If all of the above answers are "NO", then may proceed with Cephalosporin use.      Outpatient Meds: Current Outpatient Medications  Medication Sig Dispense Refill  . amLODipine (NORVASC) 10 MG tablet Take 10 mg by mouth daily.    . clopidogrel (PLAVIX) 75 MG tablet   1  . escitalopram (LEXAPRO) 10 MG tablet 20mg   5  . insulin glargine (LANTUS) 100 UNIT/ML injection Inject 32 Units into the skin at bedtime.     [name] JARDIANCE 10 MG TABS tablet TK 1 T PO D  1  . linaclotide  (LINZESS) 72 MCG capsule Take 72 mcg by mouth daily as needed (constipation).    [name] losartan (COZAAR) 50 MG tablet Take 50 mg by mouth daily.    . metFORMIN (GLUCOPHAGE) 500 MG tablet TK 1 T PO BID  3  . metoprolol succinate (TOPROL-XL) 50 MG 24 hr tablet Take 50 mg by mouth daily. Take with or immediately following a meal.    . rosuvastatin (CRESTOR) 20 MG tablet TK 1 T PO D  1  . zolpidem (AMBIEN) 5 MG tablet TK 1 T PO QHS PRN INSOMNIA  2   No current facility-administered medications for this visit.       ___________________________________________________________________ Objective   Exam:  BP 140/84 (BP Location: Left Arm, Patient Position: Sitting, Cuff Size: Normal)   Pulse 64   Ht 5\' 2"  (1.575 m) Comment: height measured without shoes  Wt 155 lb 6 oz (70.5 kg)   BMI 28.42 kg/m    General: this is a(n) well-appearing.  Eyes: sclera anicteric, + strabismus   ENT: oral mucosa moist without lesions, no cervical or supraclavicular lymphadenopathy, good dentition  CV: RRR without murmur, S1/S2, no JVD, no peripheral edema  Resp: clear to auscultation bilaterally, normal RR and effort noted  GI: soft, no tenderness, with active bowel sounds. No guarding or palpable organomegaly noted.  Skin; warm and dry, no rash or jaundice noted Gets on exam table without assistance, slow but steady gait.  Labs:  No data accompanying referral  Assessment: Encounter Diagnoses  Name Primary?  . Special screening for malignant neoplasms, colon Yes  . History of CVA (cerebrovascular accident)     She needs colon cancer screening, and is agreeable to a colonoscopy.  We discussed the nature of the procedure including risks and benefits.  The benefits and risks of the planned procedure were described in detail with the patient or (when appropriate) their health care proxy.  Risks were outlined as including, but not limited to, bleeding, infection, perforation, adverse medication reaction  leading to cardiac or pulmonary decompensation, or pancreatitis (if ERCP).  The limitation of incomplete mucosal visualization was also discussed.  No guarantees or warranties were given.  Patient at increased risk for cardiopulmonary complications of procedure due to medical comorbidities.  She will need to be off Plavix 5 days prior.  Nashalie reports having done so prior to a pituitary tumor biopsy.  We will clear this with her neurologist to be sure.  Thank you for the courtesy of this consult.  Please call me with any questions or concerns.  [name]  CC: Velna Hatchet, MD

## 2018-01-23 NOTE — Telephone Encounter (Signed)
[facility] Pre-operative Risk Assessment     Request for surgical clearance:     Endoscopy Procedure  What type of surgery is being performed?     Colonoscopy    When is this surgery scheduled?     02-17-2018 @ 8am  What type of clearance is required ?   Pharmacy  Are there any medications that need to be held prior to surgery and how long? Plavix / 5 days  Practice name and name of physician performing surgery?      [facility]  What is your office phone and fax number?      Phone- [phone]  [phone]  Anesthesia type (None, local, MAC, general) ?       MAC

## 2018-01-23 NOTE — Telephone Encounter (Signed)
Patient may hold Plavix for 5 days prior to elective colonoscopy procedure with a small but acceptable peri-procedure risk of TIA/stroke if patient is willing

## 2018-01-23 NOTE — Telephone Encounter (Signed)
Okay to proceed.  

## 2018-01-24 NOTE — Telephone Encounter (Signed)
Pt notified and aware. She states clear understanding.

## 2018-02-17 ENCOUNTER — Encounter: Payer: Self-pay | Admitting: Gastroenterology

## 2018-02-17 ENCOUNTER — Ambulatory Visit (AMBULATORY_SURGERY_CENTER): Payer: Medicare Other | Admitting: Gastroenterology

## 2018-02-17 ENCOUNTER — Other Ambulatory Visit: Payer: Self-pay

## 2018-02-17 VITALS — BP 112/50 | HR 63 | Temp 96.0°F | Resp 17 | Ht 62.0 in | Wt 155.0 lb

## 2018-02-17 DIAGNOSIS — D122 Benign neoplasm of ascending colon: Secondary | ICD-10-CM

## 2018-02-17 DIAGNOSIS — D123 Benign neoplasm of transverse colon: Secondary | ICD-10-CM | POA: Diagnosis not present

## 2018-02-17 DIAGNOSIS — Z1211 Encounter for screening for malignant neoplasm of colon: Secondary | ICD-10-CM | POA: Diagnosis not present

## 2018-02-17 MED ORDER — SODIUM CHLORIDE 0.9 % IV SOLN: 500.0000 mL | Freq: Once | INTRAVENOUS | Status: DC

## 2018-02-17 NOTE — Op Note (Signed)
Claudia Jenkins Patient Name: Claudia Jenkins Procedure Date: 02/17/2018 7:50 AM MRN: [id number] Endoscopist: Mallie Mussel L. Loletha Carrow , MD Age: 72 Referring MD:  Date of Birth: 04-Feb-1946 Gender: Female Account #: [id number] Procedure:                Colonoscopy Indications:              Screening for colorectal malignant neoplasm, This                            is the patient's first colonoscopy Medicines:                Monitored Anesthesia Care Procedure:                Pre-Anesthesia Assessment:                           - Prior to the procedure, a History and Physical                            was performed, and patient medications and                            allergies were reviewed. The patient's tolerance of                            previous anesthesia was also reviewed. The risks                            and benefits of the procedure and the sedation                            options and risks were discussed with the patient.                            All questions were answered, and informed consent                            was obtained. Prior Anticoagulants: The patient has                            taken Plavix (clopidogrel), last dose was 5 days                            prior to procedure. ASA Grade Assessment: III - A                            patient with severe systemic disease. After                            reviewing the risks and benefits, the patient was                            deemed in satisfactory condition to undergo the  procedure.                           After obtaining informed consent, the colonoscope                            was passed under direct vision. Throughout the                            procedure, the patient's blood pressure, pulse, and                            oxygen saturations were monitored continuously. The                            Colonoscope was introduced through the anus and                 advanced to the the cecum, identified by                            appendiceal orifice and ileocecal valve. The                            colonoscopy was performed without difficulty. The                            patient tolerated the procedure well. The quality                            of the bowel preparation was excellent. The                            ileocecal valve, appendiceal orifice, and rectum                            were photographed. Scope In: 8:03:18 AM Scope Out: 8:47:04 AM Scope Withdrawal Time: 0 hours 39 minutes 8 seconds  Total Procedure Duration: 0 hours 43 minutes 46 seconds  Findings:                 The digital rectal exam findings include decreased                            sphincter tone.                           A 18-20 mm polyp was found in the distal ascending                            colon, just proximal to the hepatic flexure. The                            polyp was pedunculated. The polyp was removed with  a hot snare. Resection and retrieval were complete                            after removal and ablation of polyp tissue at base.                            To prevent bleeding post-intervention, two                            hemostatic clips were successfully placed (MR                            conditional). 2-3 cm distal to polypectomy site,                            and area was tattooed with an injection of 0.5 mL                            of Spot (carbon black).                           A 4 mm polyp was found in the splenic flexure. The                            polyp was sessile. The polyp was removed with a                            cold snare. Resection and retrieval were complete.                           The exam was otherwise without abnormality on                            direct and retroflexion views. Complications:            No immediate complications. Estimated Blood Loss:      Estimated blood loss was minimal. Impression:               - Decreased sphincter tone found on digital rectal                            exam.                           - One 18-20 mm polyp in the distal ascending colon,                            removed with a hot snare. Resected and retrieved.                            Clips (MR conditional) were placed. Tattooed.                           - One 4 mm polyp at the splenic flexure,  removed                            with a cold snare. Resected and retrieved.                           - The examination was otherwise normal on direct                            and retroflexion views. Recommendation:           - Patient has a contact number available for                            emergencies. The signs and symptoms of potential                            delayed complications were discussed with the                            patient. Return to normal activities tomorrow.                            Written discharge instructions were provided to the                            patient.                           - Resume previous diet.                           - Resume Plavix (clopidogrel) at prior dose in 5                            days.                           - Await pathology results.                           - Repeat colonoscopy is recommended for                            surveillance. The colonoscopy date will be                            determined after pathology results from today's                            exam become available for review. Natalyn Szymanowski L. Loletha Carrow, MD 02/17/2018 8:54:20 AM This report has been signed electronically.

## 2018-02-17 NOTE — Progress Notes (Signed)
Called to room to assist during endoscopic procedure.  Patient ID and intended procedure confirmed with present staff. Received instructions for my participation in the procedure from the performing physician.  

## 2018-02-17 NOTE — Progress Notes (Signed)
A and O x3. Report to RN. Tolerated MAC anesthesia well.

## 2018-02-17 NOTE — Patient Instructions (Addendum)
**  Handout given on polyps and a clip card**   YOU HAD AN ENDOSCOPIC PROCEDURE TODAY: Refer to the procedure report and other information in the discharge instructions given to you for any specific questions about what was found during the examination. If this information does not answer your questions, please call [place] office at [phone] to clarify.   YOU SHOULD EXPECT: Some feelings of bloating in the abdomen. Passage of more gas than usual. Walking can help get rid of the air that was put into your GI tract during the procedure and reduce the bloating. If you had a lower endoscopy (such as a colonoscopy or flexible sigmoidoscopy) you may notice spotting of blood in your stool or on the toilet paper. Some abdominal soreness may be present for a day or two, also.  DIET: Your first meal following the procedure should be a light meal and then it is ok to progress to your normal diet. A half-sandwich or bowl of soup is an example of a good first meal. Heavy or fried foods are harder to digest and may make you feel nauseous or bloated. Drink plenty of fluids but you should avoid alcoholic beverages for 24 hours. If you had a esophageal dilation, please see attached instructions for diet.    ACTIVITY: Your care partner should take you home directly after the procedure. You should plan to take it easy, moving slowly for the rest of the day. You can resume normal activity the day after the procedure however YOU SHOULD NOT DRIVE, use power tools, machinery or perform tasks that involve climbing or major physical exertion for 24 hours (because of the sedation medicines used during the test).   SYMPTOMS TO REPORT IMMEDIATELY: A gastroenterologist can be reached at any hour. Please call [phone]  for any of the following symptoms:  Following lower endoscopy (colonoscopy, flexible sigmoidoscopy) Excessive amounts of blood in the stool  Significant tenderness, worsening of abdominal pains  Swelling of  the abdomen that is new, acute  Fever of 100 or higher    FOLLOW UP:  If any biopsies were taken you will be contacted by phone or by letter within the next 1-3 weeks. Call [phone]  if you have not heard about the biopsies in 3 weeks.  Please also call with any specific questions about appointments or follow up tests.

## 2018-02-18 ENCOUNTER — Telehealth: Payer: Self-pay | Admitting: *Deleted

## 2018-02-18 NOTE — Telephone Encounter (Signed)
  Follow up Call-  Call back number 02/17/2018  Post procedure Call Back phone  # [phone]  Permission to leave phone message Yes  Some recent data might be hidden     Patient questions:  Do you have a fever, pain , or abdominal swelling? No. Pain Score  0 *  Have you tolerated food without any problems? Yes.    Have you been able to return to your normal activities? Yes.    Do you have any questions about your discharge instructions: Diet   No. Medications  No. Follow up visit  No.  Do you have questions or concerns about your Care? No.  Actions: * If pain score is 4 or above: No action needed, pain <4.

## 2018-02-20 ENCOUNTER — Encounter: Payer: Self-pay | Admitting: Gastroenterology

## 2018-05-16 ENCOUNTER — Other Ambulatory Visit: Payer: Self-pay | Admitting: Neurosurgery

## 2018-05-16 DIAGNOSIS — D352 Benign neoplasm of pituitary gland: Secondary | ICD-10-CM

## 2018-06-16 ENCOUNTER — Ambulatory Visit
Admission: RE | Admit: 2018-06-16 | Discharge: 2018-06-16 | Disposition: A | Discharge status: Home / Self Care | Payer: Medicare Other | Source: Ambulatory Visit | Attending: Neurosurgery | Admitting: Neurosurgery

## 2018-06-16 DIAGNOSIS — D352 Benign neoplasm of pituitary gland: Secondary | ICD-10-CM

## 2018-06-16 MED ORDER — GADOBENATE DIMEGLUMINE 529 MG/ML IV SOLN
7.0000 mL | Freq: Once | INTRAVENOUS | Status: AC | PRN
  Administered 2018-06-16: 7 mL via INTRAVENOUS

## 2018-07-13 IMAGING — MR MR HEAD WO/W CM
14 of 19 series · 29 of 48 positions shown · IV contrast (multihance)
Comparison: 11/08/2016.

CLINICAL DATA: Continued surveillance of pituitary tumor.

EXAM:
MRI HEAD WITHOUT AND WITH CONTRAST
TECHNIQUE: Multiplanar, multiecho pulse sequences of the brain and surrounding
structures were obtained without and with intravenous contrast.
CONTRAST:  7mL MULTIHANCE GADOBENATE DIMEGLUMINE 529 MG/ML IV SOLN
Creatinine was obtained on site at [HOSPITAL] at [HOSPITAL].
Results: Creatinine 0.8 mg/dL.

[Series 2: T1 · sagittal · 5.0mm · 0.45mm/px · 1 of 19 slices shown (1 of 3)]
[im 1/19]
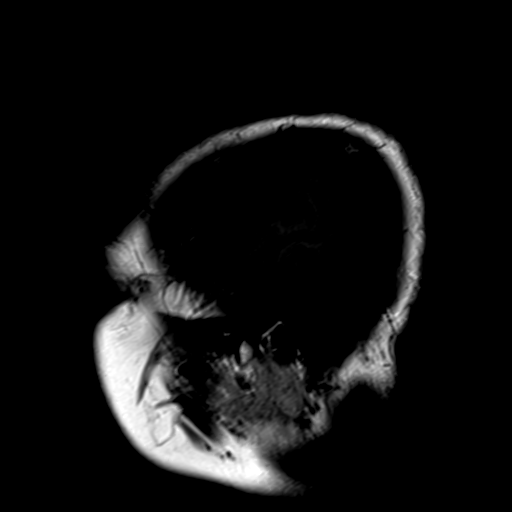

[Series 3: T2 · axial · 5.0mm · 0.30mm/px · z∈[-43,+88]mm · 2 of 22 slices shown]
[im 1/22]
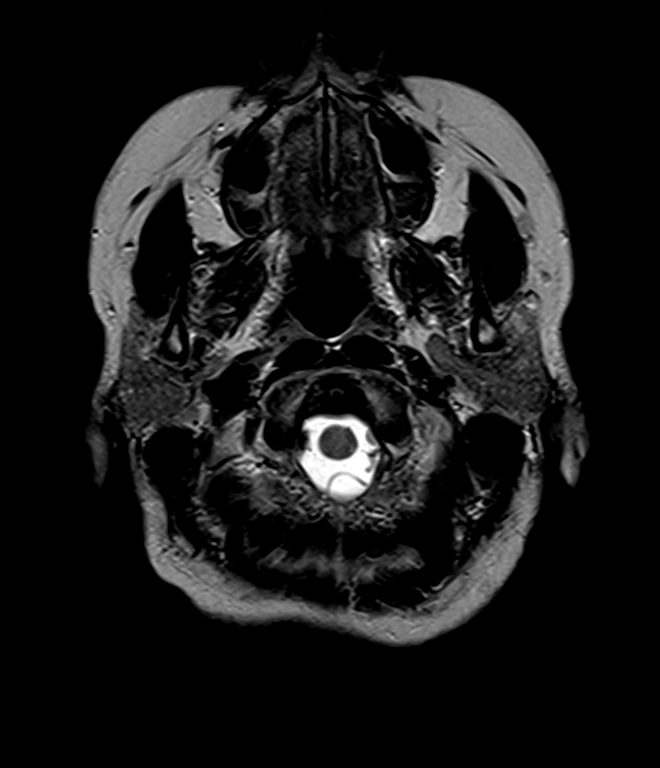
[im 22/22]
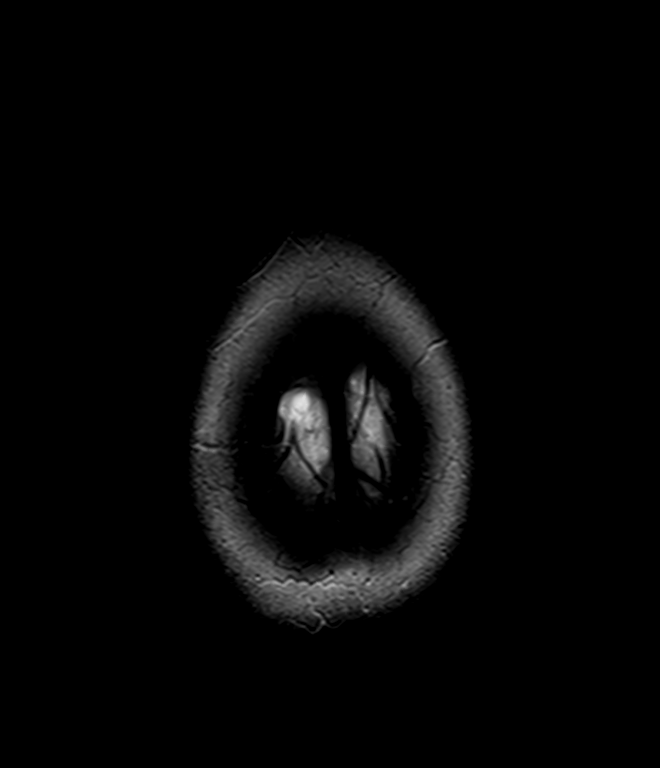

[Series 4: ep2d_diff_(id)_trace · axial · 3.0mm · 1.80mm/px · z∈[-45,+89]mm · 8 of 92 slices shown]
[im 1/92]
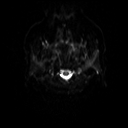
[im 12/92]
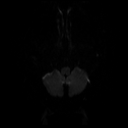
[im 23/92]
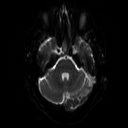
[im 35/92]
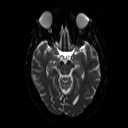
[im 57/92]
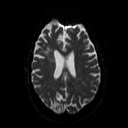
[im 69/92]
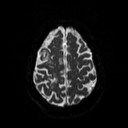
[im 80/92]
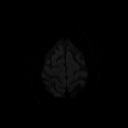
[im 92/92]
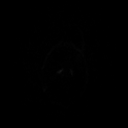

[Series 5: ep2d_diff_(id)_trace_adc · axial · 3.0mm · 1.80mm/px · z∈[-45,+89]mm · 4 of 46 slices shown]
[im 1/46]
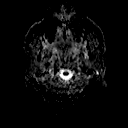
[im 16/46]
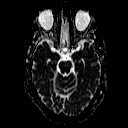
[im 31/46]
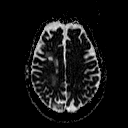
[im 46/46]
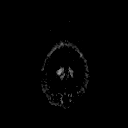

[Series 6: T1 · sagittal · 3.0mm · 0.35mm/px · 1 of 12 slices shown (2 of 3)]
[im 1/12]
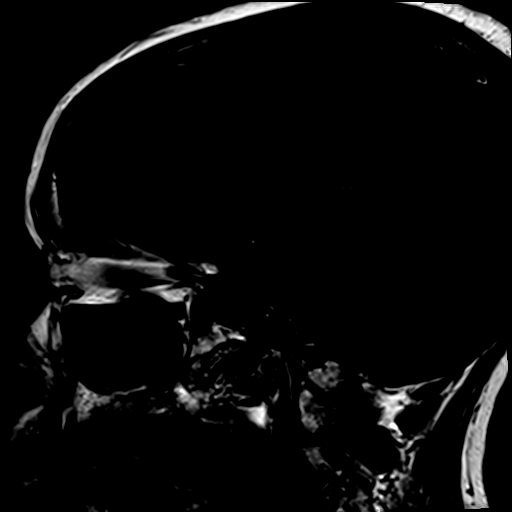

[Series 7: FLAIR · axial · 3.0mm · 0.43mm/px · z∈[-45,+90]mm · 3 of 30 slices shown]
[im 1/30]
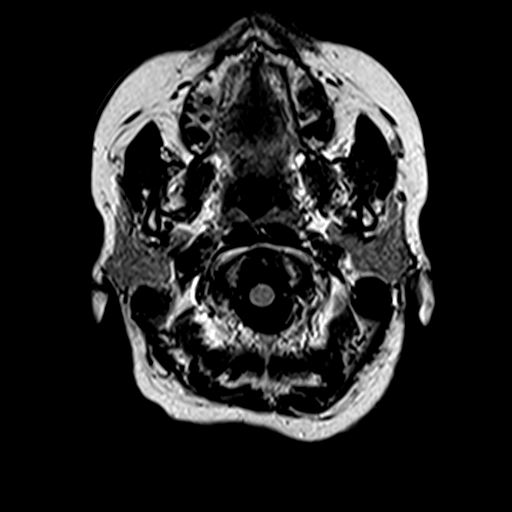
[im 15/30]
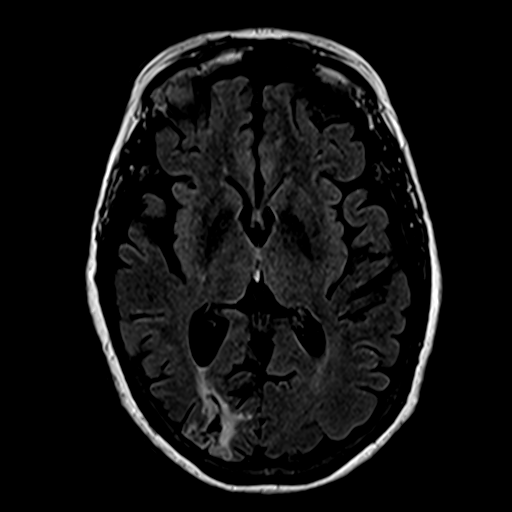
[im 30/30]
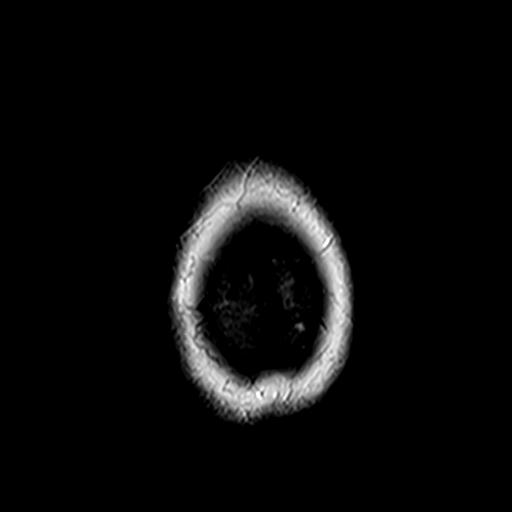

[Series 8: T1 · coronal · 3.0mm · 0.35mm/px · 1 of 12 slices shown (3 of 3)]
[im 1/12]
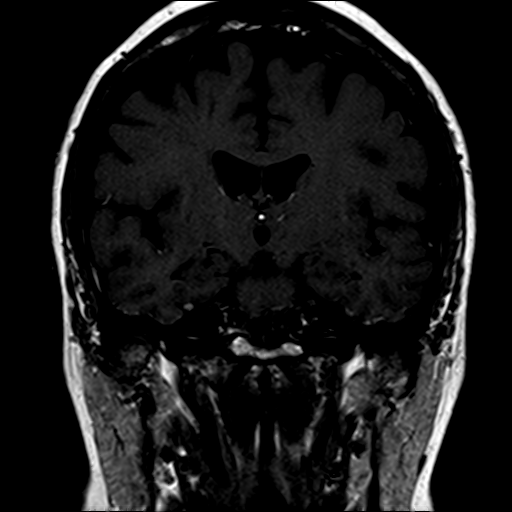

[Series 9: GRE · axial · 5.0mm · 0.45mm/px · z∈[-42,+89]mm · 2 of 22 slices shown]
[im 1/22]
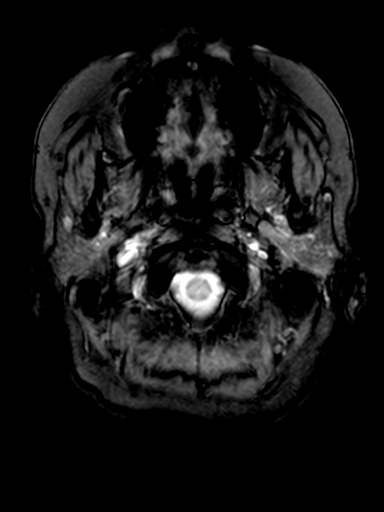
[im 22/22]
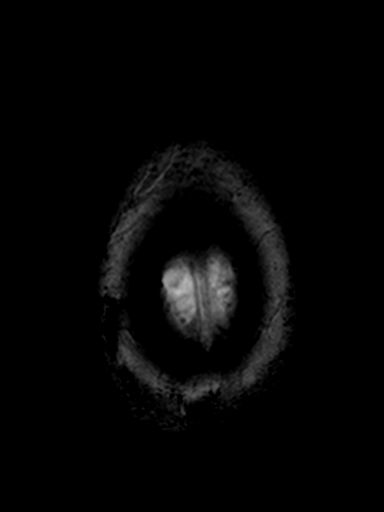

[Series 10: pre cor · coronal · non-contrast · 3.0mm · 0.35mm/px · 1 of 9 slices shown]
[im 1/9]
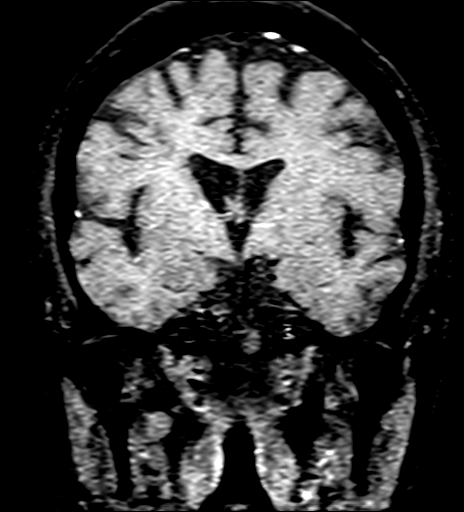

[Series 11: post cor dynamic · coronal · 3.0mm · 0.35mm/px · 1 of 9 slices shown (1 of 2)]
[im 1/9]
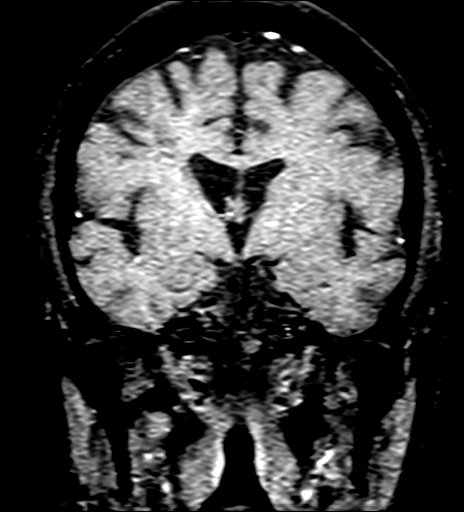

[Series 12: post cor dynamic · coronal · 3.0mm · 0.35mm/px · 1 of 9 slices shown (2 of 2)]
[im 1/9]
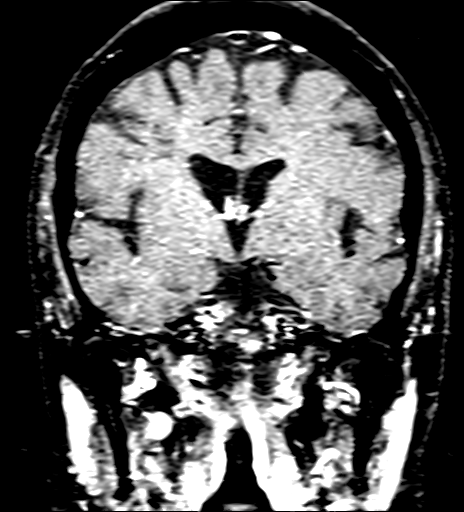

[Series 17: T1 post-contrast · coronal · 3.0mm · 0.35mm/px · 1 of 12 slices shown (1 of 3)]
[im 1/12]
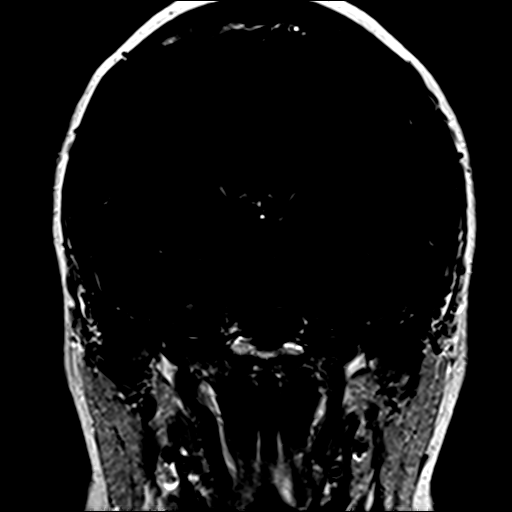

[Series 18: T1 post-contrast · sagittal · 3.0mm · 0.35mm/px · 1 of 12 slices shown (2 of 3)]
[im 1/12]
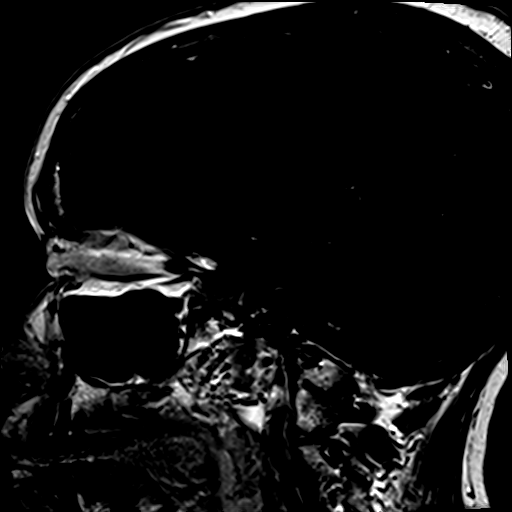

[Series 20: T1 post-contrast · coronal · 5.0mm · 0.43mm/px · 2 of 24 slices shown (3 of 3)]
[im 1/24]
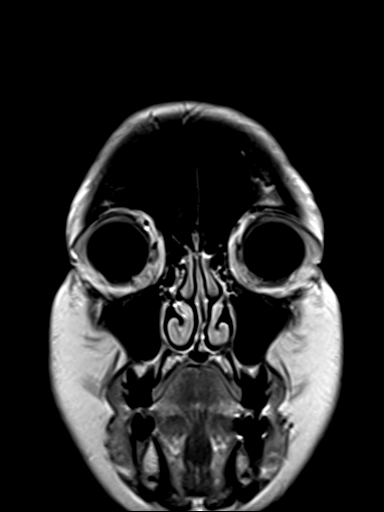
[im 24/24]
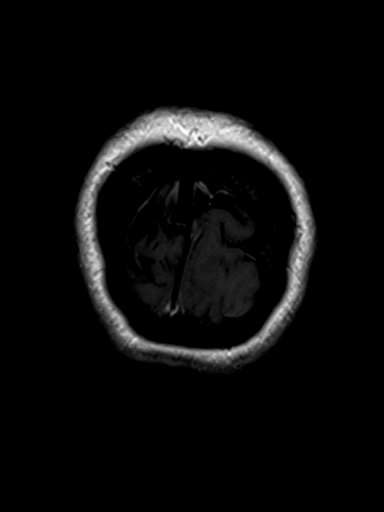

[29 of 48 positions shown; findings below may reference images not displayed]

FINDINGS: Brain: No evidence for acute infarction, hemorrhage, mass lesion,
hydrocephalus, or extra-axial fluid. Generalized atrophy. Chronic
microvascular ischemic change. Old RIGHT-sided watershed infarcts,
extending to the RIGHT occipital region.

The patient presented in [REDACTED] with recent infarcts, and at that
time, the patient was discovered to have an enlarged sella turcica.
There has been previous biopsy of tumor presenting into the sphenoid
sinus, which yielded pituitary adenoma. Using comparable
measurements to the previous scan in [REDACTED], the tumor appears
slightly decreased. Cross-section of 19 x 16 x 8 mm compares with 26
x 24 x 9 mm previous. BILATERAL tumor does extend into the cavernous
sinuses, greater on the RIGHT. The sella is partially empty, with
the chiasm stretched downward.

Vascular: Normal flow voids.

Skull and upper cervical spine: No calvarial abnormalities.
Skullbase erosions secondary pituitary tumor.

Sinuses/Orbits: No layering paranasal sinus fluid. No orbital
findings.

Other: None.
IMPRESSION: Residual pituitary adenoma, in the skull base, and extending into
both cavernous sinuses, greater on the RIGHT. Compared with October 2016, there appears to have been slight decrease in overall size of
the tumor. Continued surveillance is warranted.

## 2018-08-18 ENCOUNTER — Encounter: Payer: Self-pay | Admitting: Gastroenterology

## 2018-10-30 ENCOUNTER — Encounter: Payer: Self-pay | Admitting: General Surgery

## 2018-10-31 ENCOUNTER — Ambulatory Visit (INDEPENDENT_AMBULATORY_CARE_PROVIDER_SITE_OTHER): Payer: Medicare Other | Admitting: Gastroenterology

## 2018-10-31 ENCOUNTER — Other Ambulatory Visit: Payer: Self-pay

## 2018-10-31 ENCOUNTER — Encounter: Payer: Self-pay | Admitting: Gastroenterology

## 2018-10-31 VITALS — Ht 63.0 in | Wt 175.0 lb

## 2018-10-31 DIAGNOSIS — Z86010 Personal history of colonic polyps: Secondary | ICD-10-CM | POA: Diagnosis not present

## 2018-10-31 DIAGNOSIS — Z8673 Personal history of transient ischemic attack (TIA), and cerebral infarction without residual deficits: Secondary | ICD-10-CM | POA: Diagnosis not present

## 2018-10-31 NOTE — Progress Notes (Signed)
This patient contacted our office requesting a physician telemedicine consultation regarding clinical questions and/or test results. Interactive audio and video telecommunications were attempted between this provider and the patient.  However, this technology failed due to the patient having technical difficulties OR they did not have access to video capabilities.  We continued and completed the visit with audio only.   Participants on the conference : myself and patient   The patient consented to this consultation and was aware that a charge will be placed through their insurance.  I was in my office and the patient was at home   Encounter time:  Total time 16 minutes, with 10 minutes spent with patient on phone   _____________________________________________________________________________________________            Claudia Jenkins GI Progress Note  Chief Complaint: History of colon polyp  Subjective  History: Claudia Jenkins had her for screening colonoscopy with me in September 2019.  A 17 mm pedunculated right colon tubular adenoma with focal high-grade dysplasia was removed en bloc, site closed with clips and tattoo applied.  An additional diminutive tubular adenoma was also removed.  Claudia Jenkins has been feeling well since I last saw her, with no change in her chronic medical conditions or overall health.  She is able to get around reasonably well even with the functional limitation after prior stroke. She has had no change in bowel habits or rectal bleeding.  No dysphagia or vomiting.  ROS: Cardiovascular:  no chest pain Respiratory: no dyspnea  The patient's Past Medical, Family and Social History were reviewed and are on file in the EMR. Past Medical History:  Diagnosis Date  . Cataract   . Clotting disorder ([place])   . Depression   . Diabetes mellitus without complication ([place])   . High cholesterol   . Hypertension   . Stroke [facility]) 05/2016    Objective:  Med list  reviewed  Current Outpatient Medications:  .  amLODipine (NORVASC) 10 MG tablet, Take 10 mg by mouth daily., Disp: , Rfl:  .  clopidogrel (PLAVIX) 75 MG tablet, , Disp: , Rfl: 1 .  escitalopram (LEXAPRO) 10 MG tablet, 20mg , Disp: , Rfl: 5 .  insulin glargine (LANTUS) 100 UNIT/ML injection, Inject 32 Units into the skin at bedtime. , Disp: , Rfl:  .  JARDIANCE 10 MG TABS tablet, TK 1 T PO D, Disp: , Rfl: 1 .  losartan (COZAAR) 50 MG tablet, Take 50 mg by mouth daily., Disp: , Rfl:  .  metFORMIN (GLUCOPHAGE) 500 MG tablet, TK 1 T PO BID, Disp: , Rfl: 3 .  metoprolol succinate (TOPROL-XL) 50 MG 24 hr tablet, Take 50 mg by mouth daily. Take with or immediately following a meal., Disp: , Rfl:  .  rosuvastatin (CRESTOR) 20 MG tablet, TK 1 T PO D, Disp: , Rfl: 1 .  zolpidem (AMBIEN) 5 MG tablet, TK 1 T PO QHS PRN INSOMNIA, Disp: , Rfl: 2  She is on Plavix since her right hemisphere watershed infarction in January 2018. Cardiology was agreeable to her being off Plavix 5 days prior to her last colonoscopy, and she did not experience any post polypectomy bleeding.   No exam-virtual visit    @ASSESSMENTPLANBEGIN @ Assessment: Encounter Diagnoses  Name Primary?  . Personal history of colonic polyps Yes  . History of CVA (cerebrovascular accident)    She is due for surveillance colonoscopy since the last polyp was large and had high-grade dysplasia. [name] feels she is ready to do that, and was  agreeable after discussion of procedure and risks.  The benefits and risks of the planned procedure were described in detail with the patient or (when appropriate) their health care proxy.  Risks were outlined as including, but not limited to, bleeding, infection, perforation, adverse medication reaction leading to cardiac or pulmonary decompensation.  The limitation of incomplete mucosal visualization was also discussed.  No guarantees or warranties were given.  She was made aware of the small but real risk  of stroke while briefly off her antiplatelet agent.  Her prescribing physician was agreeable to her being off it prior to last year's colonoscopy, so that will be the same plan this year.  Plan:  My office will contact this patient to schedule a surveillance colonoscopy in our endoscopy lab. No Plavix 5 days prior to procedure. Usual diabetic preprocedure medication adjustments.  [name]

## 2018-11-03 NOTE — Progress Notes (Signed)
Left message to return call 

## 2018-11-10 ENCOUNTER — Other Ambulatory Visit: Payer: Self-pay

## 2018-12-03 ENCOUNTER — Telehealth: Payer: Self-pay | Admitting: Gastroenterology

## 2018-12-03 ENCOUNTER — Telehealth: Payer: Self-pay

## 2018-12-03 NOTE — Telephone Encounter (Signed)
 [facility] Pre-operative Risk Assessment     Request for surgical clearance:     Endoscopy Procedure  What type of surgery is being performed?     Colonoscopy   When is this surgery scheduled?     12-11-2018  What type of clearance is required ?   Pharmacy  Are there any medications that need to be held prior to surgery and how long? Yes, Plavix, 5 days  Practice name and name of physician performing surgery? [facility]  What is your office phone and fax number?      Phone- [phone]  [phone]  Anesthesia type (None, local, MAC, general) ?       MAC

## 2018-12-03 NOTE — Telephone Encounter (Signed)
Patient called in regards to her upcoming schedule colon. She stated she wanted to know about if she needs to stop plavix she stated that she was not instructed.

## 2018-12-04 NOTE — Telephone Encounter (Signed)
I have reviewed pt`s chart and not found any stroke/TIAs in last 6 months.Patient may hold Plavix for 5 days prior to elective colonoscopy procedure with a small but acceptable peri-procedure risk of TIA/stroke if patient is willing

## 2018-12-05 NOTE — Telephone Encounter (Signed)
See other communication for clearance

## 2018-12-05 NOTE — Telephone Encounter (Signed)
Pt has been notified and aware. She states clear understanding to hold Plavix for 5 days

## 2018-12-08 ENCOUNTER — Telehealth: Payer: Self-pay | Admitting: Gastroenterology

## 2018-12-08 MED ORDER — NA SULFATE-K SULFATE-MG SULF 17.5-3.13-1.6 GM/177ML PO SOLN: 1.0000 | Freq: Once | ORAL | 0 refills | Status: AC

## 2018-12-08 NOTE — Telephone Encounter (Signed)
Prep sent as requested

## 2018-12-10 ENCOUNTER — Telehealth: Payer: Self-pay | Admitting: Gastroenterology

## 2018-12-10 NOTE — Telephone Encounter (Signed)

## 2018-12-11 ENCOUNTER — Encounter: Payer: Self-pay | Admitting: Gastroenterology

## 2018-12-11 ENCOUNTER — Ambulatory Visit (AMBULATORY_SURGERY_CENTER): Payer: Medicare Other | Admitting: Gastroenterology

## 2018-12-11 ENCOUNTER — Other Ambulatory Visit: Payer: Self-pay

## 2018-12-11 VITALS — BP 140/64 | HR 55 | Temp 98.4°F | Resp 16 | Ht 63.0 in | Wt 175.0 lb

## 2018-12-11 DIAGNOSIS — Z860100 Personal history of colon polyps, unspecified: Secondary | ICD-10-CM

## 2018-12-11 DIAGNOSIS — D123 Benign neoplasm of transverse colon: Secondary | ICD-10-CM

## 2018-12-11 MED ORDER — SODIUM CHLORIDE 0.9 % IV SOLN: 500.0000 mL | Freq: Once | INTRAVENOUS | Status: DC

## 2018-12-11 NOTE — Progress Notes (Signed)
Called to room to assist during endoscopic procedure.  Patient ID and intended procedure confirmed with present staff. Received instructions for my participation in the procedure from the performing physician.  

## 2018-12-11 NOTE — Op Note (Signed)
[place] Patient Name: Claudia Jenkins Procedure Date: 12/11/2018 8:08 AM MRN: [id number] Endoscopist: Eagan. Loletha Carrow , MD Age: 73 Referring MD:  Date of Birth: 10-17-1945 Gender: Female Account #: [id number] Procedure:                Colonoscopy Indications:              Increased risk colon cancer surveillance: Personal                            history of adenoma with high grade dysplasia (33mm                            right colon pedunculated TA with HGD and diminutive                            TA 01/2018) Medicines:                Monitored Anesthesia Care Procedure:                Pre-Anesthesia Assessment:                           - Prior to the procedure, a History and Physical                            was performed, and patient medications and                            allergies were reviewed. The patient's tolerance of                            previous anesthesia was also reviewed. The risks                            and benefits of the procedure and the sedation                            options and risks were discussed with the patient.                            All questions were answered, and informed consent                            was obtained. Prior Anticoagulants: The patient has                            taken Plavix (clopidogrel), last dose was 5 days                            prior to procedure. ASA Grade Assessment: III - A                            patient with severe systemic disease. After  reviewing the risks and benefits, the patient was                            deemed in satisfactory condition to undergo the                            procedure.                           After obtaining informed consent, the colonoscope                            was passed under direct vision. Throughout the                            procedure, the patient's blood pressure, pulse, and   oxygen saturations were monitored continuously. The                            Colonoscope was introduced through the anus and                            advanced to the the cecum, identified by                            appendiceal orifice and ileocecal valve. The                            colonoscopy was performed without difficulty. The                            patient tolerated the procedure well. The quality                            of the bowel preparation was good. The ileocecal                            valve, appendiceal orifice, and rectum were                            photographed. Scope In: 8:14:38 AM Scope Out: 8:27:36 AM Scope Withdrawal Time: 0 hours 9 minutes 58 seconds  Total Procedure Duration: 0 hours 12 minutes 58 seconds  Findings:                 The perianal and digital rectal examinations were                            normal.                           A tattoo was seen at the hepatic flexure. The                            tattoo site appeared normal (no clips, scar or  polyp seen)                           A diminutive polyp was found in the mid transverse                            colon. The polyp was sessile. The polyp was removed                            with a cold biopsy forceps. Resection and retrieval                            were complete.                           The exam was otherwise without abnormality on                            direct and retroflexion views. Complications:            No immediate complications. Estimated Blood Loss:     Estimated blood loss was minimal. Impression:               - A tattoo was seen at the hepatic flexure. The                            tattoo site appeared normal.                           - One diminutive polyp in the mid transverse colon,                            removed with a cold biopsy forceps. Resected and                            retrieved.                            - The examination was otherwise normal on direct                            and retroflexion views. Recommendation:           - Patient has a contact number available for                            emergencies. The signs and symptoms of potential                            delayed complications were discussed with the                            patient. Return to normal activities tomorrow.                            Written discharge instructions were provided to the  patient.                           - Resume previous diet.                           - Resume Plavix (clopidogrel) at prior dose today.                           - Await pathology results.                           - Repeat colonoscopy in 3 years for surveillance. Henry L. Loletha Carrow, MD 12/11/2018 8:36:33 AM This report has been signed electronically.

## 2018-12-11 NOTE — Progress Notes (Signed)
PT taken to PACU. Monitors in place. VSS. Report given to RN. 

## 2018-12-11 NOTE — Patient Instructions (Signed)
Read all of the handouts given to you by your recovery room nurse. You may restart your plavix today at the previous dose.  Thank-you for choosing [place] for your medical needs today.  YOU HAD AN ENDOSCOPIC PROCEDURE TODAY AT [facility]:   Refer to the procedure report that was given to you for any specific questions about what was found during the examination.  If the procedure report does not answer your questions, please call your gastroenterologist to clarify.  If you requested that your care partner not be given the details of your procedure findings, then the procedure report has been included in a sealed envelope for you to review at your convenience later.  YOU SHOULD EXPECT: Some feelings of bloating in the abdomen. Passage of more gas than usual.  Walking can help get rid of the air that was put into your GI tract during the procedure and reduce the bloating. If you had a lower endoscopy (such as a colonoscopy or flexible sigmoidoscopy) you may notice spotting of blood in your stool or on the toilet paper. If you underwent a bowel prep for your procedure, you may not have a normal bowel movement for a few days.  Please Note:  You might notice some irritation and congestion in your nose or some drainage.  This is from the oxygen used during your procedure.  There is no need for concern and it should clear up in a day or so.  SYMPTOMS TO REPORT IMMEDIATELY:   Following lower endoscopy (colonoscopy or flexible sigmoidoscopy):  Excessive amounts of blood in the stool  Significant tenderness or worsening of abdominal pains  Swelling of the abdomen that is new, acute  Fever of 100F or higher   For urgent or emergent issues, a gastroenterologist can be reached at any hour by calling [phone].   DIET:  We do recommend a small meal at first, but then you may proceed to your regular diet.  Drink plenty of fluids but you should avoid alcoholic beverages for 24  hours.  ACTIVITY:  You should plan to take it easy for the rest of today and you should NOT DRIVE or use heavy machinery until tomorrow (because of the sedation medicines used during the test).    FOLLOW UP: Our staff will call the number listed on your records 48-72 hours following your procedure to check on you and address any questions or concerns that you may have regarding the information given to you following your procedure. If we do not reach you, we will leave a message.  We will attempt to reach you two times.  During this call, we will ask if you have developed any symptoms of COVID 19. If you develop any symptoms (ie: fever, flu-like symptoms, shortness of breath, cough etc.) before then, please call [phone].  If you test positive for Covid 19 in the 2 weeks post procedure, please call and report this information to [place].    If any biopsies were taken you will be contacted by phone or by letter within the next 1-3 weeks.  Please call us at [phone] if you have not heard about the biopsies in 3 weeks.    SIGNATURES/CONFIDENTIALITY: You and/or your care partner have signed paperwork which will be entered into your electronic medical record.  These signatures attest to the fact that that the information above on your After Visit Summary has been reviewed and is understood.  Full responsibility of the confidentiality of this discharge  information lies with you and/or your care-partner. 

## 2018-12-15 ENCOUNTER — Telehealth: Payer: Self-pay | Admitting: *Deleted

## 2018-12-15 NOTE — Telephone Encounter (Signed)
  Follow up Call-  Call back number 12/11/2018 02/17/2018  Post procedure Call Back phone  # [phone]  Permission to leave phone message Yes Yes  Some recent data might be hidden     1. Patient questions:Have you developed a fever since your procedure? no  2.   Have you had an respiratory symptoms (SOB or cough) since your procedure? no  3.   Have you tested positive for COVID 19 since your procedure no  4.   Have you had any family members/close contacts diagnosed with the COVID 19 since your procedure?  no   If yes to any of these questions please route to Joylene John, RN and Alphonsa Gin, Therapist, sports.   Do you have a fever, pain , or abdominal swelling? No. Pain Score  0 *  Have you tolerated food without any problems? Yes.    Have you been able to return to your normal activities? Yes.    Do you have any questions about your discharge instructions: Diet   No. Medications  No. Follow up visit  No.  Do you have questions or concerns about your Care? No.  Actions: * If pain score is 4 or above: No action needed, pain <4.

## 2018-12-16 ENCOUNTER — Encounter: Payer: Self-pay | Admitting: Gastroenterology

## 2018-12-23 ENCOUNTER — Other Ambulatory Visit: Payer: Self-pay | Admitting: Cardiology

## 2019-01-05 ENCOUNTER — Other Ambulatory Visit: Payer: Self-pay

## 2019-01-05 DIAGNOSIS — Z20822 Contact with and (suspected) exposure to covid-19: Secondary | ICD-10-CM

## 2019-01-06 LAB — NOVEL CORONAVIRUS, NAA: SARS-CoV-2, NAA: NOT DETECTED

## 2019-01-16 ENCOUNTER — Other Ambulatory Visit: Payer: Self-pay

## 2019-01-16 DIAGNOSIS — Z20822 Contact with and (suspected) exposure to covid-19: Secondary | ICD-10-CM

## 2019-01-17 LAB — NOVEL CORONAVIRUS, NAA: SARS-CoV-2, NAA: DETECTED — AB

## 2019-01-26 ENCOUNTER — Other Ambulatory Visit: Payer: Self-pay

## 2019-01-26 DIAGNOSIS — Z20822 Contact with and (suspected) exposure to covid-19: Secondary | ICD-10-CM

## 2019-01-28 LAB — NOVEL CORONAVIRUS, NAA: SARS-CoV-2, NAA: NOT DETECTED

## 2019-07-04 ENCOUNTER — Other Ambulatory Visit: Payer: Self-pay | Admitting: Neurosurgery

## 2019-07-15 ENCOUNTER — Other Ambulatory Visit: Payer: Self-pay | Admitting: Neurosurgery

## 2019-07-15 DIAGNOSIS — D352 Benign neoplasm of pituitary gland: Secondary | ICD-10-CM

## 2019-08-07 ENCOUNTER — Ambulatory Visit
Admission: RE | Admit: 2019-08-07 | Discharge: 2019-08-07 | Disposition: A | Discharge status: Home / Self Care | Payer: Medicare Other | Source: Ambulatory Visit | Attending: Neurosurgery | Admitting: Neurosurgery

## 2019-08-07 ENCOUNTER — Other Ambulatory Visit: Payer: Self-pay

## 2019-08-07 DIAGNOSIS — D352 Benign neoplasm of pituitary gland: Secondary | ICD-10-CM

## 2019-08-07 MED ORDER — GADOBENATE DIMEGLUMINE 529 MG/ML IV SOLN
8.0000 mL | Freq: Once | INTRAVENOUS | Status: AC | PRN
  Administered 2019-08-07: 8 mL via INTRAVENOUS

## 2020-01-17 ENCOUNTER — Other Ambulatory Visit: Payer: Self-pay | Admitting: Cardiology

## 2021-08-07 ENCOUNTER — Other Ambulatory Visit: Payer: Self-pay | Admitting: Neurosurgery

## 2021-08-07 DIAGNOSIS — D352 Benign neoplasm of pituitary gland: Secondary | ICD-10-CM

## 2021-08-30 ENCOUNTER — Other Ambulatory Visit: Payer: Medicare Other

## 2021-09-07 ENCOUNTER — Ambulatory Visit
Admission: RE | Admit: 2021-09-07 | Discharge: 2021-09-07 | Disposition: A | Discharge status: Home / Self Care | Payer: Medicare Other | Source: Ambulatory Visit | Attending: Neurosurgery | Admitting: Neurosurgery

## 2021-09-07 DIAGNOSIS — D352 Benign neoplasm of pituitary gland: Secondary | ICD-10-CM

## 2021-09-07 MED ORDER — GADOBENATE DIMEGLUMINE 529 MG/ML IV SOLN
7.0000 mL | Freq: Once | INTRAVENOUS | Status: AC | PRN
  Administered 2021-09-07: 7 mL via INTRAVENOUS

## 2022-01-08 ENCOUNTER — Encounter: Payer: Self-pay | Admitting: Gastroenterology

## 2022-03-28 ENCOUNTER — Encounter: Payer: Self-pay | Admitting: Gastroenterology

## 2022-03-28 ENCOUNTER — Ambulatory Visit (INDEPENDENT_AMBULATORY_CARE_PROVIDER_SITE_OTHER): Payer: Medicare Other | Admitting: Gastroenterology

## 2022-03-28 ENCOUNTER — Telehealth: Payer: Self-pay

## 2022-03-28 VITALS — BP 160/80 | HR 80 | Ht 62.0 in | Wt 166.5 lb

## 2022-03-28 DIAGNOSIS — Z7902 Long term (current) use of antithrombotics/antiplatelets: Secondary | ICD-10-CM | POA: Diagnosis not present

## 2022-03-28 DIAGNOSIS — Z86010 Personal history of colonic polyps: Secondary | ICD-10-CM

## 2022-03-28 MED ORDER — NA SULFATE-K SULFATE-MG SULF 17.5-3.13-1.6 GM/177ML PO SOLN: 1.0000 | Freq: Once | ORAL | 0 refills | Status: DC

## 2022-03-28 MED ORDER — NA SULFATE-K SULFATE-MG SULF 17.5-3.13-1.6 GM/177ML PO SOLN: 1.0000 | Freq: Once | ORAL | 0 refills | Status: AC

## 2022-03-28 NOTE — Progress Notes (Addendum)
[facility] Consult Note:  History: Claudia Jenkins Hem 03/28/2022  Referring provider: Velna Hatchet, MD  Reason for consult/chief complaint: hx of colon polyps (To discuss having a colonoscopy)   Subjective  HPI:  Claudia Jenkins was here to discuss a surveillance colonoscopy. Colonoscopy 12/11/2018 for following indication: Increased risk colon cancer surveillance: Personal history of adenoma with high grade dysplasia (23m right colon pedunculated TA with HGD and diminutive TA 01/2018)  On 2020 exam: The perianal and digital rectal examinations were normal. - A tattoo was seen at the hepatic flexure. The tattoo site appeared normal (no clips, scar or polyp seen) - A diminutive polyp was found in the mid transverse colon. The polyp was sessile. The polyp was removed with a cold biopsy forceps. Resection and retrieval were complete. - The exam was otherwise without abnormality on direct and retroflexion views.  She was able to hold her Plavix 5 days prior to that procedure, and has not had a TIA or recurrent CVA since then.  Bowel habits are regular without rectal bleeding, and she denies chronic upper digestive symptoms.  Her main complaint is chronic fatigue. ROS:  Review of Systems Fatigue Remainder systems negative except as above  Past Medical History: Past Medical History:  Diagnosis Date   Cataract    Clotting disorder ([facility]    Depression    Diabetes mellitus without complication ([facility]    High cholesterol    Hypertension    [place]     Past Surgical History: Past Surgical History:  Procedure Laterality Date   BREAST BIOPSY Left 2018   CATARACT EXTRACTION W/ INTRAOCULAR LENS  IMPLANT, BILATERAL Bilateral    LUMBAR DISC SURGERY     NASAL SINUS SURGERY N/A 08/29/2016   Procedure: NASAL ENDOSCOPY WITH BIOPSY OF SPHENOID AND CLIVIUS MASS;  Surgeon: JIzora Gala MD;  Location: [facility] OR;  Service: ENT;  Laterality: N/A;     Family  History: Family History  Problem Relation Age of Onset   Stroke Sister    Diabetes Sister    Colon cancer Neg Hx    Esophageal cancer Neg Hx    Rectal cancer Neg Hx    Stomach cancer Neg Hx    Liver cancer Neg Hx     Social History: Social History   Socioeconomic History   Marital status: Married    Spouse name: anthony   Number of children: 1   Years of education: Not on file   Highest education level: Not on file  Occupational History   Occupation: retired  Tobacco Use   Smoking status: Never   Smokeless tobacco: Never  Vaping Use   Vaping Use: Never used  Substance and Sexual Activity   Alcohol use: No   Drug use: No   Sexual activity: Not Currently    Partners: Male    Birth control/protection: None  Other Topics Concern   Not on file  Social History Narrative   Not on file   Social Determinants of Health   Financial Resource Strain: Not on file  Food Insecurity: Not on file  Transportation Needs: Not on file  Physical Activity: Not on file  Stress: Not on file  Social Connections: Not on file    Allergies: Allergies  Allergen Reactions   Latex Dermatitis and Other (See Comments)    Very uncomfortable feeling when using latex gloves ,irritation   Penicillins Rash    Has patient had a PCN reaction causing immediate rash, facial/tongue/throat swelling, SOB or lightheadedness  with hypotension: #  #  #  YES  #  #  #  Has patient had a PCN reaction causing severe rash involving mucus membranes or skin necrosis: No Has patient had a PCN reaction that required hospitalization No Has patient had a PCN reaction occurring within the last 10 years: No If all of the above answers are "NO", then may proceed with Cephalosporin use.      Outpatient Meds: Current Outpatient Medications  Medication Sig Dispense Refill   amLODipine (NORVASC) 10 MG tablet Take 10 mg by mouth daily.     B-D ULTRAFINE III SHORT PEN 31G X 8 MM MISC Inject 1 Syringe into the skin  daily.     clopidogrel (PLAVIX) 75 MG tablet   1   Insulin Glargine (BASAGLAR KWIKPEN) 100 UNIT/ML Inject 25 Units into the skin at bedtime.     JARDIANCE 10 MG TABS tablet TK 1 T PO D  1   losartan (COZAAR) 100 MG tablet Take 100 mg by mouth daily.     metFORMIN (GLUCOPHAGE) 500 MG tablet TK 1 T PO BID  3   metoprolol succinate (TOPROL-XL) 50 MG 24 hr tablet TAKE 1 TABLET BY MOUTH DAILY 90 tablet 3   Na Sulfate-K Sulfate-Mg Sulf 17.5-3.13-1.6 GM/177ML SOLN Take 1 kit by mouth once for 1 dose. 354 mL 0   ONETOUCH VERIO test strip SMARTSIG:11.9 Via Meter Twice Daily     rosuvastatin (CRESTOR) 20 MG tablet TK 1 T PO D  1   zolpidem (AMBIEN) 5 MG tablet TK 1 T PO QHS PRN INSOMNIA  2   No current facility-administered medications for this visit.      ___________________________________________________________________ Objective   Exam:  BP (!) 160/80 (BP Location: Right Arm, Patient Position: Sitting, Cuff Size: Normal)   Pulse 80   Ht 5' 2" (1.575 m) Comment: height measured without shoes  Wt 166 lb 8 oz (75.5 kg)   BMI 30.45 kg/m  Wt Readings from Last 3 Encounters:  03/28/22 166 lb 8 oz (75.5 kg)  12/11/18 175 lb (79.4 kg)  10/31/18 175 lb (79.4 kg)    General: Well-appearing, normal vocal quality Eyes: sclera anicteric, no redness ENT: oral mucosa moist without lesions, no cervical or supraclavicular lymphadenopathy CV: Soft systolic murmur, no JVD, no peripheral edema Resp: clear to auscultation bilaterally, normal RR and effort noted GI: soft, no tenderness, with active bowel sounds. No guarding or palpable organomegaly noted. Skin; warm and dry, no rash or jaundice noted Neuro: awake, alert and oriented x 3. Normal gross motor function and fluent speech  Data:  2020 colonoscopy pathology Surgical [P], colon, transverse, polyp - TUBULAR ADENOMA (1 OF 1 FRAGMENTS) - NO HIGH GRADE DYSPLASIA OR MALIGNANCY IDENTIFIED  Assessment: Encounter Diagnoses  Name Primary?    Personal history of colonic polyps Yes   Long term (current) use of antithrombotics/antiplatelets     Due for surveillance colonoscopy.  She was agreeable after discussion of procedure and risks.  The benefits and risks of the planned procedure were described in detail with the patient or (when appropriate) their health care proxy.  Risks were outlined as including, but not limited to, bleeding, infection, perforation, adverse medication reaction leading to cardiac or pulmonary decompensation, pancreatitis (if ERCP).  The limitation of incomplete mucosal visualization was also discussed.  No guarantees or warranties were given.  Hold Plavix 5 days prior.  We will message her neurologist to see if they have any objection to this.  I  expect it would be okay since she was able to do so for her last procedure and has had no subsequent neurologic events.   Thank you for the courtesy of this consult.  Please call me with any questions or concerns.  [name]  CC: Referring provider noted above

## 2022-03-28 NOTE — Telephone Encounter (Signed)
[facility] Pre-operative Risk Assessment     Request for surgical clearance:     Endoscopy Procedure  What type of surgery is being performed?     Colonoscopy  When is this surgery scheduled?     05-09-2022  What type of clearance is required ?   Pharmacy  Are there any medications that need to be held prior to surgery and how long? Yes, Plavix   Practice name and name of physician performing surgery?      [facility]  What is your office phone and fax number?      Phone- [phone]  [phone]  Anesthesia type (None, local, MAC, general) ?       MAC

## 2022-03-28 NOTE — Patient Instructions (Addendum)
_______________________________________________________  If you are age 74 or older, your body mass index should be between 23-30. Your Body mass index is 30.45 kg/m. If this is out of the aforementioned range listed, please consider follow up with your Primary Care Provider.  If you are age 60 or younger, your body mass index should be between 19-25. Your Body mass index is 30.45 kg/m. If this is out of the aformentioned range listed, please consider follow up with your Primary Care Provider.   ________________________________________________________  The [facility] providers would like to encourage you to use [facility] to communicate with providers for non-urgent requests or questions.  Due to long hold times on the telephone, sending your provider a message by [facility] may be a faster and more efficient way to get a response.  Please allow 48 business hours for a response.  Please remember that this is for non-urgent requests.  _______________________________________________________  Dennis Bast have been scheduled for an endoscopy and colonoscopy. Please follow the written instructions given to you at your visit today. Please pick up your prep supplies at the pharmacy within the next 1-3 days. If you use inhalers (even only as needed), please bring them with you on the day of your procedure.  You will be contacted by our office prior to your procedure for directions on holding your Plavix.  If you do not hear from our office 1 week prior to your scheduled procedure, please call [phone] to discuss.    Due to recent changes in healthcare laws, you may see the results of your imaging and laboratory studies on MyChart before your provider has had a chance to review them.  We understand that in some cases there may be results that are confusing or concerning to you. Not all laboratory results come back in the same time frame and the provider may be waiting for multiple results in order to interpret  others.  Please give [place] 48 hours in order for your provider to thoroughly review all the results before contacting the office for clarification of your results.   It was a pleasure to see you today!  Thank you for trusting me with your gastrointestinal care!

## 2022-03-30 NOTE — Telephone Encounter (Signed)
Patient has been notified and aware to hold Plavix for 5 days prior to her procedure.

## 2022-05-01 ENCOUNTER — Encounter: Payer: Self-pay | Admitting: Gastroenterology

## 2022-05-08 ENCOUNTER — Encounter: Payer: Self-pay | Admitting: Certified Registered Nurse Anesthetist

## 2022-05-08 ENCOUNTER — Telehealth: Payer: Self-pay | Admitting: Gastroenterology

## 2022-05-08 MED ORDER — NA SULFATE-K SULFATE-MG SULF 17.5-3.13-1.6 GM/177ML PO SOLN: 1.0000 | Freq: Once | ORAL | 0 refills | Status: AC

## 2022-05-08 NOTE — Telephone Encounter (Signed)
Rx has been sent to the pharmacy

## 2022-05-08 NOTE — Telephone Encounter (Signed)
Patient called stated she needs the prep medication sent to [facility] on [address] for her procedure scheduled for tomorrow.

## 2022-05-09 ENCOUNTER — Encounter: Payer: Self-pay | Admitting: Gastroenterology

## 2022-05-09 ENCOUNTER — Ambulatory Visit (AMBULATORY_SURGERY_CENTER): Payer: Medicare Other | Admitting: Gastroenterology

## 2022-05-09 VITALS — BP 132/72 | HR 83 | Temp 96.0°F | Resp 13 | Ht 62.0 in | Wt 166.0 lb

## 2022-05-09 DIAGNOSIS — D123 Benign neoplasm of transverse colon: Secondary | ICD-10-CM | POA: Diagnosis not present

## 2022-05-09 DIAGNOSIS — Z86010 Personal history of colonic polyps: Secondary | ICD-10-CM

## 2022-05-09 DIAGNOSIS — Z09 Encounter for follow-up examination after completed treatment for conditions other than malignant neoplasm: Secondary | ICD-10-CM | POA: Diagnosis not present

## 2022-05-09 MED ORDER — SODIUM CHLORIDE 0.9 % IV SOLN: 500.0000 mL | Freq: Once | INTRAVENOUS | Status: DC

## 2022-05-09 NOTE — Progress Notes (Signed)
Report given to PACU, vss 

## 2022-05-09 NOTE — Patient Instructions (Signed)
Handout on polyps and diverticulosis given. Resume Plavix at prior dose today.  Continue present medications.     YOU HAD AN ENDOSCOPIC PROCEDURE TODAY AT [facility]:   Refer to the procedure report that was given to you for any specific questions about what was found during the examination.  If the procedure report does not answer your questions, please call your gastroenterologist to clarify.  If you requested that your care partner not be given the details of your procedure findings, then the procedure report has been included in a sealed envelope for you to review at your convenience later.  YOU SHOULD EXPECT: Some feelings of bloating in the abdomen. Passage of more gas than usual.  Walking can help get rid of the air that was put into your GI tract during the procedure and reduce the bloating. If you had a lower endoscopy (such as a colonoscopy or flexible sigmoidoscopy) you may notice spotting of blood in your stool or on the toilet paper. If you underwent a bowel prep for your procedure, you may not have a normal bowel movement for a few days.  Please Note:  You might notice some irritation and congestion in your nose or some drainage.  This is from the oxygen used during your procedure.  There is no need for concern and it should clear up in a day or so.  SYMPTOMS TO REPORT IMMEDIATELY:  Following lower endoscopy (colonoscopy or flexible sigmoidoscopy):  Excessive amounts of blood in the stool  Significant tenderness or worsening of abdominal pains  Swelling of the abdomen that is new, acute  Fever of 100F or higher   For urgent or emergent issues, a gastroenterologist can be reached at any hour by calling [phone]. Do not use MyChart messaging for urgent concerns.    DIET:  We do recommend a small meal at first, but then you may proceed to your regular diet.  Drink plenty of fluids but you should avoid alcoholic beverages for 24 hours.  ACTIVITY:  You  should plan to take it easy for the rest of today and you should NOT DRIVE or use heavy machinery until tomorrow (because of the sedation medicines used during the test).    FOLLOW UP: Our staff will call the number listed on your records the next business day following your procedure.  We will call around 7:15- 8:00 am to check on you and address any questions or concerns that you may have regarding the information given to you following your procedure. If we do not reach you, we will leave a message.     If any biopsies were taken you will be contacted by phone or by letter within the next 1-3 weeks.  Please call us at [phone] if you have not heard about the biopsies in 3 weeks.    SIGNATURES/CONFIDENTIALITY: You and/or your care partner have signed paperwork which will be entered into your electronic medical record.  These signatures attest to the fact that that the information above on your After Visit Summary has been reviewed and is understood.  Full responsibility of the confidentiality of this discharge information lies with you and/or your care-partner.

## 2022-05-09 NOTE — Progress Notes (Signed)
Pt's states no medical or surgical changes since previsit or office visit. 

## 2022-05-09 NOTE — Progress Notes (Signed)
Called to room to assist during endoscopic procedure.  Patient ID and intended procedure confirmed with present staff. Received instructions for my participation in the procedure from the performing physician.  

## 2022-05-09 NOTE — Progress Notes (Signed)
History and Physical:  This patient presents for endoscopic testing for: Encounter Diagnosis  Name Primary?   Personal history of colonic polyps Yes    From 03/28/22 office note Colonoscopy 12/11/2018 for following indication: Increased risk colon cancer surveillance: Personal history of adenoma with high grade dysplasia (64m right colon pedunculated TA with HGD and diminutive TA 01/2018)   On 2020 exam: The perianal and digital rectal examinations were normal. - A tattoo was seen at the hepatic flexure. The tattoo site appeared normal (no clips, scar or polyp seen) - A diminutive polyp was found in the mid transverse colon. The polyp was sessile. The polyp was removed with a cold biopsy forceps. Resection and retrieval were complete. - The exam was otherwise without abnormality on direct and retroflexion views  No clinical changes since the 03/28/22 office visit  Patient is otherwise without complaints or active issues today.   Past Medical History: Past Medical History:  Diagnosis Date   Cataract    [place]otting disorder ([facility]    Depression    Diabetes mellitus without complication ([place]    High cholesterol    Hypertension    [place]     Past Surgical History: Past Surgical History:  Procedure Laterality Date   BREAST BIOPSY Left 2018   CATARACT EXTRACTION W/ INTRAOCULAR LENS  IMPLANT, BILATERAL Bilateral    LUMBAR DISC SURGERY     NASAL SINUS SURGERY N/A 08/29/2016   Procedure: NASAL ENDOSCOPY WITH BIOPSY OF SPHENOID AND CLIVIUS MASS;  Surgeon: JIzora Gala MD;  [facility]  Service: ENT;  Laterality: N/A;    Allergies: Allergies  Allergen Reactions   Latex Dermatitis and Other (See Comments)    Very uncomfortable feeling when using latex gloves ,irritation   Penicillins Rash    Has patient had a PCN reaction causing immediate rash, facial/tongue/throat swelling, SOB or lightheadedness with hypotension: #  #  #  YES  #  #  #  Has patient had a PCN  reaction causing severe rash involving mucus membranes or skin necrosis: No Has patient had a PCN reaction that required hospitalization No Has patient had a PCN reaction occurring within the last 10 years: No If all of the above answers are "NO", then may proceed with Cephalosporin use.      Outpatient Meds: Current Outpatient Medications  Medication Sig Dispense Refill   amLODipine (NORVASC) 10 MG tablet Take 10 mg by mouth daily.     B-D ULTRAFINE III SHORT PEN 31G X 8 MM MISC Inject 1 Syringe into the skin daily.     Insulin Glargine (BASAGLAR KWIKPEN) 100 UNIT/ML Inject 25 Units into the skin at bedtime.     JARDIANCE 10 MG TABS tablet TK 1 T PO D  1   losartan (COZAAR) 100 MG tablet Take 100 mg by mouth daily.     metFORMIN (GLUCOPHAGE) 500 MG tablet TK 1 T PO BID  3   metoprolol succinate (TOPROL-XL) 50 MG 24 hr tablet TAKE 1 TABLET BY MOUTH DAILY 90 tablet 3   ONETOUCH VERIO test strip SMARTSIG:11.9 Via Meter Twice Daily     rosuvastatin (CRESTOR) 20 MG tablet TK 1 T PO D  1   zolpidem (AMBIEN) 5 MG tablet TK 1 T PO QHS PRN INSOMNIA  2   clopidogrel (PLAVIX) 75 MG tablet   1   Current Facility-Administered Medications  Medication Dose Route Frequency Provider Last Rate Last Admin   0.9 %  sodium chloride infusion  500  mL Intravenous Once Doran Stabler, MD          ___________________________________________________________________ Objective   Exam:  BP (!) 153/75   Pulse 91   Temp (!) 96 F (35.6 C)   Resp 10   Ht '5\' 2"'$  (1.575 m)   Wt 166 lb (75.3 kg)   SpO2 99%   BMI 30.36 kg/m   CV: regular , S1/S2 Resp: clear to auscultation bilaterally, normal RR and effort noted GI: soft, no tenderness, with active bowel sounds.   Assessment: Encounter Diagnosis  Name Primary?   Personal history of colonic polyps Yes     Plan: Colonoscopy  The benefits and risks of the planned procedure were described in detail with the patient or (when appropriate) their  health care proxy.  Risks were outlined as including, but not limited to, bleeding, infection, perforation, adverse medication reaction leading to cardiac or pulmonary decompensation, pancreatitis (if ERCP).  The limitation of incomplete mucosal visualization was also discussed.  No guarantees or warranties were given.    The patient is appropriate for an endoscopic procedure in the ambulatory setting.   - Wilfrid Lund, MD

## 2022-05-09 NOTE — Op Note (Signed)
[facility] Patient Name: Claudia Jenkins Procedure Date: 05/09/2022 7:53 AM MRN: [id number] Endoscopist: [place]. Loletha Carrow , MD, [id number] Age: 76 Referring MD:  Date of Birth: 1945-08-12 Gender: Female Account #: [id number] Procedure:                Colonoscopy Indications:              Surveillance: Personal history of adenomatous                            polyps on last colonoscopy 3 years ago                           September 2019: 17 mm right colon pedunculated                            tubular adenoma with high-grade dysplasia.                           July 2020: Diminutive transverse colon tubular                            adenoma. Medicines:                Monitored Anesthesia Care Procedure:                Pre-Anesthesia Assessment:                           - Prior to the procedure, a History and Physical                            was performed, and patient medications and                            allergies were reviewed. The patient's tolerance of                            previous anesthesia was also reviewed. The risks                            and benefits of the procedure and the sedation                            options and risks were discussed with the patient.                            All questions were answered, and informed consent                            was obtained. Prior Anticoagulants: The patient has                            taken Plavix (clopidogrel), last dose was 5 days  prior to procedure. ASA Grade Assessment: III - A                            patient with severe systemic disease. After                            reviewing the risks and benefits, the patient was                            deemed in satisfactory condition to undergo the                            procedure.                           After obtaining informed consent, the colonoscope                            was passed under direct  vision. Throughout the                            procedure, the patient's blood pressure, pulse, and                            oxygen saturations were monitored continuously. The                            CF [id number] #[id number] was introduced through the anus                            and advanced to the the cecum, identified by                            appendiceal orifice and ileocecal valve. The                            colonoscopy was performed without difficulty. The                            patient tolerated the procedure well. The quality                            of the bowel preparation was excellent. The                            ileocecal valve, appendiceal orifice, and rectum                            were photographed. Scope In: 8:06:27 AM Scope Out: 8:23:01 AM Scope Withdrawal Time: 0 hours 10 minutes 2 seconds  Total Procedure Duration: 0 hours 16 minutes 34 seconds  Findings:                 The perianal and digital rectal examinations were  normal.                           Repeat examination of right colon under NBI                            performed.                           A tattoo was seen in the distal ascending colon.                            The tattoo site appeared normal.                           Two sessile polyps were found in the transverse                            colon. The polyps were 5 mm in size. These polyps                            were removed with a cold snare. Resection and                            retrieval were complete.                           A few diverticula were found in the right colon.                           The exam was otherwise without abnormality on                            direct and retroflexion views. Complications:            No immediate complications. Estimated Blood Loss:     Estimated blood loss was minimal. Impression:               - A tattoo was seen in the distal  ascending colon.                            The tattoo site appeared normal.                           - Two 5 mm polyps in the transverse colon, removed                            with a cold snare. Resected and retrieved.                           - Diverticulosis in the right colon.                           - The examination was otherwise normal on direct  and retroflexion views. Recommendation:           - Patient has a contact number available for                            emergencies. The signs and symptoms of potential                            delayed complications were discussed with the                            patient. Return to normal activities tomorrow.                            Written discharge instructions were provided to the                            patient.                           - Resume previous diet.                           - Continue present medications.                           - Await pathology results.                           - Resume Plavix (clopidogrel) at prior dose today.                           - No repeat surveillance colonoscopy recommended. Verlee Pope L. Loletha Carrow, MD 05/09/2022 8:31:05 AM This report has been signed electronically.

## 2022-05-10 ENCOUNTER — Telehealth: Payer: Self-pay

## 2022-05-10 NOTE — Telephone Encounter (Signed)
  Follow up Call-     05/09/2022    7:22 AM  Call back number  Post procedure Call Back phone  # [phone]  Permission to leave phone message Yes     Patient questions:  Do you have a fever, pain , or abdominal swelling? No. Pain Score  0 *  Have you tolerated food without any problems? Yes.    Have you been able to return to your normal activities? Yes.    Do you have any questions about your discharge instructions: Diet   No. Medications  No. Follow up visit  No.  Do you have questions or concerns about your Care? No.  Actions: * If pain score is 4 or above: No action needed, pain <4.

## 2022-05-11 ENCOUNTER — Encounter: Payer: Self-pay | Admitting: Gastroenterology
